# Patient Record
Sex: Female | Born: 1970 | Race: Black or African American | Hispanic: No | Marital: Single | Smoking: Never smoker
Health system: Southern US, Community
[De-identification: ages and names within clinical notes are randomized; demographics above are authoritative.]

## PROBLEM LIST (undated history)

## (undated) DIAGNOSIS — I890 Lymphedema, not elsewhere classified: Secondary | ICD-10-CM

## (undated) HISTORY — PX: ABDOMINAL HYSTERECTOMY: SHX81

---

## 2002-01-22 ENCOUNTER — Ambulatory Visit (HOSPITAL_COMMUNITY): Admission: RE | Admit: 2002-01-22 | Discharge: 2002-01-22 | Payer: Self-pay | Admitting: Internal Medicine

## 2002-01-22 ENCOUNTER — Encounter: Payer: Self-pay | Admitting: Internal Medicine

## 2002-02-06 ENCOUNTER — Encounter: Admission: RE | Admit: 2002-02-06 | Discharge: 2002-05-07 | Payer: Self-pay | Admitting: Internal Medicine

## 2005-05-27 ENCOUNTER — Inpatient Hospital Stay (HOSPITAL_COMMUNITY): Admission: EM | Admit: 2005-05-27 | Discharge: 2005-06-02 | Payer: Self-pay | Admitting: Emergency Medicine

## 2008-03-26 ENCOUNTER — Other Ambulatory Visit: Admission: RE | Admit: 2008-03-26 | Discharge: 2008-03-26 | Payer: Self-pay | Admitting: Obstetrics and Gynecology

## 2009-07-09 ENCOUNTER — Emergency Department (HOSPITAL_COMMUNITY): Admission: EM | Admit: 2009-07-09 | Discharge: 2009-07-09 | Payer: Self-pay | Admitting: Emergency Medicine

## 2009-07-10 ENCOUNTER — Ambulatory Visit (HOSPITAL_COMMUNITY): Admission: RE | Admit: 2009-07-10 | Discharge: 2009-07-10 | Payer: Self-pay | Admitting: Emergency Medicine

## 2009-10-13 ENCOUNTER — Ambulatory Visit: Admission: RE | Admit: 2009-10-13 | Discharge: 2009-10-13 | Payer: Self-pay | Admitting: Gynecology

## 2009-11-16 ENCOUNTER — Encounter: Payer: Self-pay | Admitting: Obstetrics & Gynecology

## 2009-11-16 ENCOUNTER — Inpatient Hospital Stay (HOSPITAL_COMMUNITY): Admission: RE | Admit: 2009-11-16 | Discharge: 2009-11-18 | Payer: Self-pay | Admitting: Obstetrics & Gynecology

## 2009-12-21 ENCOUNTER — Ambulatory Visit
Admission: RE | Admit: 2009-12-21 | Discharge: 2009-12-21 | Payer: Self-pay | Source: Home / Self Care | Admitting: Gynecology

## 2010-02-11 ENCOUNTER — Other Ambulatory Visit: Admission: RE | Admit: 2010-02-11 | Discharge: 2010-02-11 | Payer: Self-pay | Admitting: Gynecology

## 2010-02-11 ENCOUNTER — Ambulatory Visit: Admission: RE | Admit: 2010-02-11 | Discharge: 2010-02-11 | Payer: Self-pay | Admitting: Gynecology

## 2010-08-12 ENCOUNTER — Other Ambulatory Visit: Admission: RE | Admit: 2010-08-12 | Discharge: 2010-08-12 | Payer: Self-pay | Admitting: Gynecology

## 2010-08-12 ENCOUNTER — Ambulatory Visit: Admission: RE | Admit: 2010-08-12 | Discharge: 2010-08-12 | Payer: Self-pay | Admitting: Gynecology

## 2011-02-20 LAB — CBC
HCT: 25.7 % — ABNORMAL LOW (ref 36.0–46.0)
Hemoglobin: 7.7 g/dL — ABNORMAL LOW (ref 12.0–15.0)
MCHC: 30.1 g/dL (ref 30.0–36.0)
MCHC: 31.1 g/dL (ref 30.0–36.0)
MCV: 61.7 fL — ABNORMAL LOW (ref 78.0–100.0)
Platelets: 221 10*3/uL (ref 150–400)
RBC: 4.17 MIL/uL (ref 3.87–5.11)
RDW: 19.1 % — ABNORMAL HIGH (ref 11.5–15.5)
RDW: 24.9 % — ABNORMAL HIGH (ref 11.5–15.5)
WBC: 4 10*3/uL (ref 4.0–10.5)

## 2011-02-20 LAB — BASIC METABOLIC PANEL
BUN: 4 mg/dL — ABNORMAL LOW (ref 6–23)
CO2: 24 mEq/L (ref 19–32)
Calcium: 8.1 mg/dL — ABNORMAL LOW (ref 8.4–10.5)
Creatinine, Ser: 0.63 mg/dL (ref 0.4–1.2)
Glucose, Bld: 132 mg/dL — ABNORMAL HIGH (ref 70–99)

## 2011-02-20 LAB — CROSSMATCH: ABO/RH(D): O POS

## 2011-02-20 LAB — DIFFERENTIAL
Eosinophils Absolute: 0.2 10*3/uL (ref 0.0–0.7)
Eosinophils Relative: 6 % — ABNORMAL HIGH (ref 0–5)
Lymphocytes Relative: 39 % (ref 12–46)
Monocytes Absolute: 0.4 10*3/uL (ref 0.1–1.0)
Neutrophils Relative %: 45 % (ref 43–77)

## 2011-02-20 LAB — COMPREHENSIVE METABOLIC PANEL
Alkaline Phosphatase: 85 U/L (ref 39–117)
BUN: 10 mg/dL (ref 6–23)
Creatinine, Ser: 0.59 mg/dL (ref 0.4–1.2)
Glucose, Bld: 91 mg/dL (ref 70–99)
Potassium: 3.8 mEq/L (ref 3.5–5.1)
Total Protein: 8.3 g/dL (ref 6.0–8.3)

## 2011-02-20 LAB — PREGNANCY, URINE: Preg Test, Ur: NEGATIVE

## 2011-02-25 LAB — BASIC METABOLIC PANEL
Calcium: 8.8 mg/dL (ref 8.4–10.5)
GFR calc Af Amer: >60 mL/min (ref >60)
GFR calc non Af Amer: >60 mL/min (ref >60)
Glucose, Bld: 98 mg/dL (ref 70–99)
Sodium: 134 mEq/L — ABNORMAL LOW (ref 135–145)

## 2011-02-25 LAB — DIFFERENTIAL
Basophils Absolute: 0 10*3/uL (ref 0.0–0.1)
Lymphocytes Relative: 12 % (ref 12–46)
Monocytes Absolute: 1.2 10*3/uL — ABNORMAL HIGH (ref 0.1–1.0)
Neutro Abs: 7.7 10*3/uL (ref 1.7–7.7)

## 2011-02-25 LAB — CBC
Hemoglobin: 9.3 g/dL — ABNORMAL LOW (ref 12.0–15.0)
RBC: 4.16 MIL/uL (ref 3.87–5.11)
RDW: 18.4 % — ABNORMAL HIGH (ref 11.5–15.5)

## 2011-04-07 NOTE — Discharge Summary (Signed)
Misty Glenn, Misty Glenn            ACCOUNT NO.:  [id number]   MEDICAL RECORD NO.:  [id number]          PATIENT TYPE:  INP   LOCATION:  A336                          FACILITY:  [facility]   PHYSICIAN:  Kingsley Callander. Ouida Sills, MD       DATE OF BIRTH:  June 20, 1971   DATE OF ADMISSION:  05/26/2005  DATE OF DISCHARGE:  07/14/2006LH                                 DISCHARGE SUMMARY   DISCHARGE DIAGNOSES:  1.  Left lower leg cellulitis.  2.  Left leg lymphedema.  3.  Hypokalemia.  4.  Hyponatremia   HOSPITAL COURSE:  This patient is a 40 year old female who presented with a  red, tender, swollen left lower leg.  Her white count was 13.8.  Her exam  was consistent with cellulitis.  She was treated with IV Unasyn.  Her  redness and swelling were slow to improve.  She initially was febrile.  Her  fever resolved.  Her swelling and redness improved.  She was felt to be  stable for discharge on July 14.   She was hyperkalemic at 3.0 on admission.  Potassium was supplemented orally  and normalized to 3.9.  Serum sodium was initially low at 129, but  normalized 137.  Blood cultures were negative.   She was treated with elevation and compression stockings.   FOLLOW UP:  She will be seen in follow-up in my office in 1 week.   DISCHARGE MEDICATIONS:  Augmentin 875 mg b.i.d.       ROF/MEDQ  D:  06/02/2005  T:  06/02/2005  Job:  [id number]

## 2011-04-07 NOTE — H&P (Signed)
Misty Glenn, Misty Glenn            ACCOUNT NO.:  [id number]   MEDICAL RECORD NO.:  [id number]          PATIENT TYPE:  INP   LOCATION:  A336                          FACILITY:  [facility]   PHYSICIAN:  Kingsley Callander. Ouida Sills, MD       DATE OF BIRTH:  01-Apr-1971   DATE OF ADMISSION:  05/26/2005  DATE OF DISCHARGE:  LH                                HISTORY & PHYSICAL   CHIEF COMPLAINT:  Left foot swelling.   HISTORY OF PRESENT ILLNESS:  This patient is a 40 year old African-American  female who presented to the emergency room with a one day history of  swelling, redness and pain in the left foot and lower leg.  She was found to  have erythema extending up into her calf with marked swelling of the foot  and lower leg.  She was febrile to 103.  She was hospitalized and started on  intravenous antibiotics by the on call physician.  She has a history of  lymphedema.   PAST MEDICAL HISTORY:  Unremarkable.   MEDICATIONS:  Lasix 40 mg daily PRN.   ALLERGIES:  COMPAZINE.   SOCIAL HISTORY:  She does not use tobacco, alcohol or recreational drugs.   REVIEW OF SYSTEMS:  Noncontributory.   PHYSICAL EXAMINATION:  VITAL SIGNS:  Temperature initially 103.2.  Pulse  118.  Respirations 20. Blood pressure 115/69.  GENERAL APPEARANCE:  Alert and oriented.  HEENT:  Eyes and oropharynx are unremarkable.  NECK:  No jugular venous distention or lymphadenopathy.  LUNGS:  Clear.  HEART:  Regular with no murmurs.  ABDOMEN:  Nontender, no hepatosplenomegaly.  EXTREMITIES:  There is 3+ edema in the left foot and lower leg.  There is  erythema and increased warmth extending nearly to the knee level.  No  ulcerations are present.  She has tinea type changes between her toes.  NEUROLOGICAL:  Intact.  LYMPH NODES:  No enlargement.   LABORATORY DATA:  White count 13.8 with 83 segs and 5 bands. Hemoglobin  13.9, platelet count 209,000.  Sodium 129, potassium 3.0, bicarb 24, glucose  109, BUN 7, creatinine 1.0.   IMPRESSION:  1.  Left lower leg cellulitis.  Blood cultures have been obtained.  She was      started on intravenous Rocephin.  She will be switched to intravenous      Unasyn.  The leg will be elevated and observed in a hospitalized setting      for now.  2.  Hypokalemia.  Will supplement orally.  3.  Hyponatremia.  4.  Tinea pedis.  5.  History of lymphedema.       ROF/MEDQ  D:  05/27/2005  T:  05/27/2005  Job:  [id number]

## 2014-09-03 ENCOUNTER — Other Ambulatory Visit (HOSPITAL_COMMUNITY): Payer: Self-pay | Admitting: Internal Medicine

## 2014-09-03 DIAGNOSIS — Z139 Encounter for screening, unspecified: Secondary | ICD-10-CM

## 2014-09-07 ENCOUNTER — Ambulatory Visit (HOSPITAL_COMMUNITY): Payer: Self-pay

## 2014-09-09 ENCOUNTER — Ambulatory Visit (HOSPITAL_COMMUNITY)
Admission: RE | Admit: 2014-09-09 | Discharge: 2014-09-09 | Disposition: A | Discharge status: Home / Self Care | Payer: BC Managed Care – PPO | Source: Ambulatory Visit | Attending: Internal Medicine | Admitting: Internal Medicine

## 2014-09-09 DIAGNOSIS — Z1231 Encounter for screening mammogram for malignant neoplasm of breast: Secondary | ICD-10-CM | POA: Insufficient documentation

## 2014-09-09 DIAGNOSIS — Z139 Encounter for screening, unspecified: Secondary | ICD-10-CM

## 2015-09-08 ENCOUNTER — Ambulatory Visit (HOSPITAL_COMMUNITY): Payer: BC Managed Care – PPO | Attending: Internal Medicine | Admitting: Physical Therapy

## 2015-09-08 DIAGNOSIS — I890 Lymphedema, not elsewhere classified: Secondary | ICD-10-CM | POA: Diagnosis not present

## 2015-09-08 DIAGNOSIS — R29898 Other symptoms and signs involving the musculoskeletal system: Secondary | ICD-10-CM | POA: Diagnosis present

## 2015-09-08 NOTE — Therapy (Signed)
[facility] [facility] [address] [place], [place], [address] Phone: [phone]   Fax:  [phone]  Physical Therapy Evaluation  Patient Details  Name: Misty Glenn MRN: [id number] Date of Birth: 09-08-1971 Referring Provider: Dr. Ouida SillsFagan  Encounter Date: 09/08/2015      PT End of Session - 09/08/15 1225    Visit Number 1   Number of Visits 12   Date for PT Re-Evaluation 10/08/15   Authorization Type BCBS   PT Start Time 0800   PT Stop Time 0850   PT Time Calculation (min) 50 min   Activity Tolerance Patient tolerated treatment well   Behavior During Therapy [facility] for tasks assessed/performed      No past medical history on file.  No past surgical history on file.  There were no vitals filed for this visit.  Visit Diagnosis:  Lymphedema  Leg heaviness      Subjective Assessment - 09/08/15 1217    Subjective Pt states that she began with cellulitis in May of 2009 in her Lt LE.  She has had no injury or trouma to this area.  She had another flare up of lymphedema in September where she was not hospitalized but she had blisters, and extreme edema which  needed antibiotics.  She was required to be out of work for two weeks.    Pertinent History cellulitis    How long can you sit comfortably? no problem   How long can you stand comfortably? no probelm    How long can you walk comfortably? no problem    Currently in Pain? No/denies  states that her leg feels tight and heavy             [facility] - 09/08/15 0001    Assessment   Medical Diagnosis lymphedema   Referring Provider Dr. Ouida SillsFagan   Onset Date/Surgical Date 07/25/15   Prior Therapy none   Precautions   Precautions --  cellulitis   Restrictions   Weight Bearing Restrictions No   Balance Screen   Has the patient fallen in the past 6 months No   Has the patient had a decrease in activity level because of a fear of falling?  No   Is the patient reluctant  to leave their home because of a fear of falling?  No   Home [employer] manag[employer]   Living Environment Private residence   Prior Function   Level of Independence Independent   Vocation Full time employment   Vocation Requirements on feet 50% of the time   Leisure would like to start exercising    Cognition   Overall Cognitive Status Within Functional Limits for tasks assessed   Observation/Other Assessments   Observations Lt leg is edematous    Other Surveys  --  lymphedema life impact:   66       Date 09/08/2015 09/08/2015   Rt  LT  MTP 22.30 26.5  ankle 25.00 29.30  4cm 24.20 27.80  8cm 27.50 29.30  12 cm 30.00 30.90  16cm 29.70 33.00  20cm 36.80 36.70  24cm 37.80 39.30  28cm 40.00 40.40  32cm 38.70 39.80  36cm 39.70 39.20  40cm    44cm    48cm                        Sum of squares 11703.13 12880.10  Total Volume 3725.2233 [id number]  [facility] Adult PT Treatment/Exercise - 09/08/15 0001    Manual Therapy   Manual Therapy Manual Lymphatic Drainage (MLD)   Manual Lymphatic Drainage (MLD) supraclavicular, deep and superficial abdominal followed by routing using inguinal axillary anastomosist and Lt LE hip to toes.  Postereior not completed due to time restraints.                 PT Education - 09/08/15 1224    Education provided Yes   Education Details given information on lympedema; exercise sheet for lymphedema, and bandage order sheet.    Person(s) Educated Patient   Methods Explanation;Handout   Comprehension Verbalized understanding          PT Short Term Goals - 09/08/15 1250    PT SHORT TERM GOAL #1   Title Pt to be I in skin care and care of bandages   Time 1   Period Weeks   PT SHORT TERM GOAL #2   Title Pt to verbalize precautions for decreasing the risk of infection    Time 1   Period Weeks   PT SHORT TERM GOAL #3   Title Pt to be I in a Home exercise program to promote lymphatic circulation    Time 1   Period Weeks    PT SHORT TERM GOAL #4   Title Pt tvolume to be decreased by 50%   Time 2   Period Weeks           PT Long Term Goals - 09/08/15 1252    PT LONG TERM GOAL #1   Title Pt to edema to be reduced by 80% to reduce risk of cellulitis.    Time 4   Period Weeks   PT LONG TERM GOAL #2   Title Pt Lymphedma life impact score to decrease by 30 points    Time 4   Period Weeks   PT LONG TERM GOAL #3   Title Pt to verbalize understanding of the maintenance phase of treatment   Time 4   Period Weeks               Plan - 09/08/15 1255    Clinical Impression Statement [name] is a 44 yo female who was diagnosed with lymphedema.  Her last flare up was in September; at this time her leg got so big that she had multiple blisters and was diagnosed with cellulitis.  She was placed on medication and stayed out of work for two weeks.  Her Lt LE is still swollen,( 374.63 cc greater than the Rt LE), although this is not nearly as swollen as it was in September.  She has been referred to skilled  out-patient physical therapy for manual techniques to decrease her volume as welll as to educate pt on how to keep this condition under control.    Pt will benefit from skilled therapeutic intervention in order to improve on the following deficits Decreased knowledge of precautions;Increased edema   Rehab Potential Good   PT Frequency 3x / week   PT Duration 4 weeks   PT Treatment/Interventions ADLs/Self Care Home Management;Patient/family education;Manual lymph drainage;Compression bandaging   PT Next Visit Plan cut foam for LE; continue with manual and begin multilayer compression bandaging.    PT Home Exercise Plan given   Consulted and Agree with Plan of Care Patient         Problem List There are no active problems to display for this patient. Virgina Organ, PT CLT [phone] 09/08/2015, 1:01  PM  [place] [facility] [address] [place],  [place], [address] Phone: [phone]   Fax:  [phone]  Name: Misty Glenn MRN: [id number] Date of Birth: Nov 06, 1971

## 2015-09-14 ENCOUNTER — Ambulatory Visit (HOSPITAL_COMMUNITY): Payer: BC Managed Care – PPO | Admitting: Physical Therapy

## 2015-09-14 DIAGNOSIS — I890 Lymphedema, not elsewhere classified: Secondary | ICD-10-CM | POA: Diagnosis not present

## 2015-09-14 DIAGNOSIS — R29898 Other symptoms and signs involving the musculoskeletal system: Secondary | ICD-10-CM

## 2015-09-14 NOTE — Therapy (Signed)
[facility] [address] [place], [place], [address] Phone: [phone]   Fax:  [phone]  Physical Therapy Treatment  Patient Details  Name: Misty Glenn MRN: [id number] Date of Birth: [birth year] Referring Provider: Dr. Ouida SillsFagan  Encounter Date: 09/14/2015      PT End of Session - 09/14/15 1608    Visit Number 2   Number of Visits 12   Date for PT Re-Evaluation 10/08/15   Authorization Type BCBS   PT Start Time 1520   PT Stop Time 1605   PT Time Calculation (min) 45 min   Activity Tolerance Patient tolerated treatment well   Behavior During Therapy [facility] for tasks assessed/performed      No past medical history on file.  No past surgical history on file.  There were no vitals filed for this visit.  Visit Diagnosis:  Lymphedema  Leg heaviness      Subjective Assessment - 09/14/15 1618    Subjective Pt comes today with bandages ordered and ready to begin treatment.  STates she has to work second shift tonight and has to leave by 4:30.  Pt without pain today.   Currently in Pain? No/denies                 PT Education - 09/14/15 1615    Education provided Yes   Education Details lymphedema causes and treatment.  Given copy of evaluation   Person(s) Educated Patient   Methods Explanation;Handout   Comprehension Verbalized understanding;Need further instruction          PT Short Term Goals - 09/14/15 1614    PT SHORT TERM GOAL #1   Title Pt to be I in skin care and care of bandages   Time 1   Period Weeks   Status On-going   PT SHORT TERM GOAL #2   Title Pt to verbalize precautions for decreasing the risk of infection    Time 1   Period Weeks   Status On-going   PT SHORT TERM GOAL #3   Title Pt to be I in a Home exercise program to promote lymphatic circulation    Time 1   Period Weeks   Status On-going   PT SHORT TERM GOAL #4   Title Pt volume to be decreased by 50%   Time 2   Period Weeks    Status On-going           PT Long Term Goals - 09/14/15 1614    PT LONG TERM GOAL #1   Title Pt to edema to be reduced by 80% to reduce risk of cellulitis.    Time 4   Period Weeks   Status On-going   PT LONG TERM GOAL #2   Title Pt Lymphedma life impact score to decrease by 30 points    Time 4   Period Weeks   Status On-going   PT LONG TERM GOAL #3   Title Pt to verbalize understanding of the maintenance phase of treatment   Time 4   Period Weeks   Status On-going               Plan - 09/14/15 1608    Clinical Impression Statement Unablle to complete manual lymph drainage today due to time.  Spent majority of session today on education.  Explanation as to what lymphedema is, why we use massage and bandaging techniques and overall treatment goals.  Pt given copy of initial evaluation wtih individualized goals  reviewed.  Foam cut for proper fit of distal Lt LE including dorsal aspect of foot.  LE moisturized and bandaged using #7 netting and multilayer compression bandages. Pt instructed to get shoes with larger toe box as ones wore to therapy would not fit over bandages.  Pt instructed to remove bandages if increased pain, numbness or unable to find proper shoes today.  Pt with interest in attending water aerobics session tomorrow.  Instructed to remove bandages prior to session and re-roll.        Pt will benefit from skilled therapeutic intervention in order to improve on the following deficits Decreased knowledge of precautions;Increased edema   Rehab Potential Good   PT Frequency 3x / week   PT Duration 4 weeks   PT Treatment/Interventions ADLs/Self Care Home Management;Patient/family education;Manual lymph drainage;Compression bandaging   PT Next Visit Plan Continue with complete decongestive therapy including Manual lymph drainage and compression bandaging.  Begin toe wraps next session.    PT Home Exercise Plan given   Consulted and Agree with Plan of Care Patient         Problem List There are no active problems to display for this patient.   Lurena Nida, PTA/CLT [phone] 09/14/2015, 4:18 PM  [place] [address] [place], [place], [address] Phone: [phone]   Fax:  [phone]  Name: Misty Glenn MRN: [id number] Date of Birth: [birth year]

## 2015-09-15 ENCOUNTER — Ambulatory Visit (HOSPITAL_COMMUNITY): Payer: BC Managed Care – PPO | Admitting: Physical Therapy

## 2015-09-15 DIAGNOSIS — I890 Lymphedema, not elsewhere classified: Secondary | ICD-10-CM

## 2015-09-15 DIAGNOSIS — R29898 Other symptoms and signs involving the musculoskeletal system: Secondary | ICD-10-CM

## 2015-09-15 NOTE — Therapy (Signed)
[facility] [facility] [address] [place], [place], [address] Phone: [phone]   Fax:  [phone]  Physical Therapy Treatment  Patient Details  Name: Misty Glenn MRN: [id number] Date of Birth: [birth year] Referring Provider: Dr. Ouida SillsFagan  Encounter Date: 09/15/2015      PT End of Session - 09/15/15 1645    Visit Number 3   Number of Visits 12   Date for PT Re-Evaluation 10/08/15   Authorization Type BCBS   PT Start Time 1515   PT Stop Time 1600   PT Time Calculation (min) 45 min   Activity Tolerance Patient tolerated treatment well   Behavior During Therapy [facility] for tasks assessed/performed      No past medical history on file.  No past surgical history on file.  There were no vitals filed for this visit.  Visit Diagnosis:  Lymphedema  Leg heaviness      Subjective Assessment - 09/15/15 1641    Subjective Pt states she kept it on until this morining at 5am.  States it began sliding down at work last night. PT reports she went to water aerobics this morning and loved it.    Currently in Pain? No/denies                         [facility] Treatment/Exercise - 09/15/15 1643    Manual Therapy   Manual Therapy Manual Lymphatic Drainage (MLD)   Manual therapy comments compression bandaging to Lt LE including toe wraps.  #7 netting, 1/2" foam, 5 layers short stretch    Manual Lymphatic Drainage (MLD) supraclavicular, deep and superficial abdominal followed by routing using inguinal axillary anastomosist and Lt LE hip to toes.  Postereior not completed due to time restraints.                 PT Education - 09/14/15 1615    Education provided Yes   Education Details lymphedema causes and treatment.  Given copy of evaluation   Person(s) Educated Patient   Methods Explanation;Handout   Comprehension Verbalized understanding;Need further instruction          PT Short Term Goals - 09/14/15 1614    PT SHORT TERM GOAL #1   Title Pt to be I in skin care and care of bandages   Time 1   Period Weeks   Status On-going   PT SHORT TERM GOAL #2   Title Pt to verbalize precautions for decreasing the risk of infection    Time 1   Period Weeks   Status On-going   PT SHORT TERM GOAL #3   Title Pt to be I in a Home exercise program to promote lymphatic circulation    Time 1   Period Weeks   Status On-going   PT SHORT TERM GOAL #4   Title Pt volume to be decreased by 50%   Time 2   Period Weeks   Status On-going           PT Long Term Goals - 09/14/15 1614    PT LONG TERM GOAL #1   Title Pt to edema to be reduced by 80% to reduce risk of cellulitis.    Time 4   Period Weeks   Status On-going   PT LONG TERM GOAL #2   Title Pt Lymphedma life impact score to decrease by 30 points    Time 4   Period Weeks   Status On-going  PT LONG TERM GOAL #3   Title Pt to verbalize understanding of the maintenance phase of treatment   Time 4   Period Weeks   Status On-going               Plan - 09/15/15 1645    Clinical Impression Statement Completed manual lymph drainage for Lt LE followed by moisurizing and compression bandaging.  Included toes today with patient instructed with step by step as she will begin banding independently. Noted reduction from one treatment with most induration on dorsal aspect of foot.    PT Next Visit Plan Continue with complete decongestive therapy including Manual lymph drainage and compression bandaging.  Continue wiith instruction and complete self bandaging. Measure on Wednesdays.   PT Home Exercise Plan given   Consulted and Agree with Plan of Care Patient        Problem List There are no active problems to display for this patient.   Lurena Nida, PTA/CLT [phone] 09/15/2015, 4:48 PM  [facility] [address] [place], [place], [address] Phone: [phone]   Fax:  [phone]  Name:  Misty Glenn MRN: [id number] Date of Birth: [birth year]

## 2015-09-16 ENCOUNTER — Encounter (HOSPITAL_COMMUNITY): Payer: BC Managed Care – PPO | Admitting: Physical Therapy

## 2015-09-17 ENCOUNTER — Ambulatory Visit (HOSPITAL_COMMUNITY): Payer: BC Managed Care – PPO | Admitting: Physical Therapy

## 2015-09-17 DIAGNOSIS — I890 Lymphedema, not elsewhere classified: Secondary | ICD-10-CM

## 2015-09-17 DIAGNOSIS — R29898 Other symptoms and signs involving the musculoskeletal system: Secondary | ICD-10-CM

## 2015-09-17 NOTE — Therapy (Addendum)
[place], [place], [address] Phone: [phone]   Fax:  [phone]  Physical Therapy Treatment  Patient Details  Name: Misty Glenn MRN: [id number] Date of Birth: [birth year] Referring Raegen Tarpley: Dr. Willey Blade  Encounter Date: 09/17/2015      PT End of Session - 09/17/15 1446    Visit Number 4   Number of Visits 12   Date for PT Re-Evaluation 10/08/15   Authorization Type BCBS   PT Start Time 1303   PT Stop Time 1350   PT Time Calculation (min) 47 min   Activity Tolerance Patient tolerated treatment well   Behavior During Therapy [facility] for tasks assessed/performed      No past medical history on file.  No past surgical history on file.  There were no vitals filed for this visit.  Visit Diagnosis:  Lymphedema  Leg heaviness      Subjective Assessment - 09/17/15 1443    Subjective Pt pleased with progress, however,  states her toe bandages came off.  Pt went to water aerobics today.    Currently in Pain? No/denies                [facility] Adult PT Treatment/Exercise - 09/17/15 1444    Manual Therapy   Manual Therapy Manual Lymphatic Drainage (MLD)   Manual therapy comments compression bandaging to Lt LE including toe wraps.  #7 netting, 1/2" foam, 5 layers short stretch    Manual Lymphatic Drainage (MLD) supraclavicular, deep and superficial abdominal followed by routing using inguinal axillary anastomosist and Lt LE hip to toes.  Postereior completed in prone.                PT Education - 09/17/15 1446    Education provided Yes   Education Details given self bandaging sheet as well as care for short stretch bandages    Person(s) Educated Patient   Methods Handout   Comprehension Verbalized understanding          PT Short Term Goals - 09/14/15 1614    PT SHORT TERM GOAL #1   Title Pt to be I in skin care and care of bandages   Time 1   Period Weeks   Status On-going   PT  SHORT TERM GOAL #2   Title Pt to verbalize precautions for decreasing the risk of infection    Time 1   Period Weeks   Status On-going   PT SHORT TERM GOAL #3   Title Pt to be I in a Home exercise program to promote lymphatic circulation    Time 1   Period Weeks   Status On-going   PT SHORT TERM GOAL #4   Title Pt volume to be decreased by 50%   Time 2   Period Weeks   Status On-going           PT Long Term Goals - 09/14/15 1614    PT LONG TERM GOAL #1   Title Pt to edema to be reduced by 80% to reduce risk of cellulitis.    Time 4   Period Weeks   Status On-going   PT LONG TERM GOAL #2   Title Pt Lymphedma life impact score to decrease by 30 points    Time 4   Period Weeks   Status On-going   PT LONG TERM GOAL #3   Title Pt to verbalize understanding of the maintenance phase of treatment   Time 4  Period Weeks   Status On-going               Plan - 09/17/15 1446    Clinical Impression Statement Cut foam for ankle.  Completed manual lymph drainage both anteriorly and posterioly.  Attempted to wrap toes again.     PT Next Visit Plan Check to see if toe wraps stayed on any better,  Continue with both anterior and posterior manual techniques.  Remeasure on Wed.         Problem List There are no active problems to display for this patient.   Cynthia Russell, PT CLT [phone] 09/17/2015, 2:49 PM  [facility] [address] [place], [place], [address] Phone: [phone]   Fax:  [phone]  Name: Misty Glenn MRN: [id number] Date of Birth: 07/12/1971    PHYSICAL THERAPY DISCHARGE SUMMARY  Visits from Start of Care: 4  Current functional level related to goals / functional outcomes: Decreased edema   Remaining deficits: edema   Education / Equipment: For precautions and exercise to improve lymphatic circulation   Plan: Patient agrees to discharge.  Patient goals were not met. Patient is  being discharged due to not returning since the last visit.  ?????     Cynthia Russell, PT CLT [phone]  

## 2015-09-21 ENCOUNTER — Ambulatory Visit (HOSPITAL_COMMUNITY): Payer: BC Managed Care – PPO | Attending: Internal Medicine | Admitting: Physical Therapy

## 2015-09-21 DIAGNOSIS — I890 Lymphedema, not elsewhere classified: Secondary | ICD-10-CM | POA: Diagnosis not present

## 2015-09-21 DIAGNOSIS — R29898 Other symptoms and signs involving the musculoskeletal system: Secondary | ICD-10-CM

## 2015-09-21 NOTE — Therapy (Signed)
[facility] [address] [place], [place], [address] Phone: [phone]   Fax:  [phone]  Physical Therapy Treatment  Patient Details  Name: Misty Glenn MRN: [id number] Date of Birth: 1971-02-15 Referring Provider: Dr. Ouida Sills  Encounter Date: 09/21/2015      PT End of Session - 09/21/15 0905    Visit Number 5   Number of Visits 12   Date for PT Re-Evaluation 10/08/15   Authorization Type BCBS   PT Start Time 0800   PT Stop Time 0855   PT Time Calculation (min) 55 min   Activity Tolerance Patient tolerated treatment well   Behavior During Therapy [facility] for tasks assessed/performed      No past medical history on file.  No past surgical history on file.  There were no vitals filed for this visit.  Visit Diagnosis:  Lymphedema  Leg heaviness      Subjective Assessment - 09/21/15 0856    Subjective Pt states she is going to be away at a conference on Friday and will not be able to make her appt.  States she also has conflict with Thursday appt.  Pt. wanting to learn self bandaging today in case she cant return this week.    Currently in Pain? No/denies                         [facility] Adult PT Treatment/Exercise - 09/21/15 0001    Manual Therapy   Manual therapy comments compression bandaging to Lt LE including toe wraps.  #7 netting, 1/2" foam, 5 layers short stretch                 PT Education - 09/21/15 0857    Education provided Yes   Education Details self bandaging   Person(s) Educated Patient   Methods Explanation   Comprehension Verbalized understanding;Returned demonstration;Need further instruction          PT Short Term Goals - 09/14/15 1614    PT SHORT TERM GOAL #1   Title Pt to be I in skin care and care of bandages   Time 1   Period Weeks   Status On-going   PT SHORT TERM GOAL #2   Title Pt to verbalize precautions for decreasing the risk of infection    Time 1   Period Weeks   Status On-going   PT SHORT TERM GOAL #3   Title Pt to be I in a Home exercise program to promote lymphatic circulation    Time 1   Period Weeks   Status On-going   PT SHORT TERM GOAL #4   Title Pt volume to be decreased by 50%   Time 2   Period Weeks   Status On-going           PT Long Term Goals - 09/14/15 1614    PT LONG TERM GOAL #1   Title Pt to edema to be reduced by 80% to reduce risk of cellulitis.    Time 4   Period Weeks   Status On-going   PT LONG TERM GOAL #2   Title Pt Lymphedma life impact score to decrease by 30 points    Time 4   Period Weeks   Status On-going   PT LONG TERM GOAL #3   Title Pt to verbalize understanding of the maintenance phase of treatment   Time 4   Period Weeks   Status On-going  Plan - 09/21/15 0905    Clinical Impression Statement Focused session on self education and ability to complete self bandaging.  Pt with good knowledge base for appropriate steps, however diffiuculty with actual technique.  Pt gives appropriate stretch of bandaging, just requires directional cues.  Manual not completed today due to time.  Pt will need reinforcement and further practice with bandaging.     PT Next Visit Plan Check to see if toe wraps stayed on any better,  Continue with self bandaging education and resume anterior and posterior manual techniques.  Remeasure next session.         Problem List There are no active problems to display for this patient.   Lurena Nidamy B Frazier, PTA/CLT [phone]  09/21/2015, 9:09 AM   [facility] [address] [place], [place], [address] Phone: [phone]   Fax:  [phone]  Name: Misty Glenn MRN: [id number] Date of Birth: September 26, 1971

## 2015-09-22 ENCOUNTER — Ambulatory Visit (HOSPITAL_COMMUNITY): Payer: BC Managed Care – PPO | Admitting: Physical Therapy

## 2015-09-23 ENCOUNTER — Encounter (HOSPITAL_COMMUNITY): Payer: BC Managed Care – PPO | Admitting: Physical Therapy

## 2015-09-23 ENCOUNTER — Ambulatory Visit (HOSPITAL_COMMUNITY): Payer: BC Managed Care – PPO | Admitting: Physical Therapy

## 2015-09-23 DIAGNOSIS — R29898 Other symptoms and signs involving the musculoskeletal system: Secondary | ICD-10-CM

## 2015-09-23 DIAGNOSIS — I890 Lymphedema, not elsewhere classified: Secondary | ICD-10-CM

## 2015-09-23 NOTE — Therapy (Signed)
[facility] [address] , [place], [address] Phone: [phone]   Fax:  [phone]  Physical Therapy Treatment  Patient Details  Name: Misty Glenn MRN: [id number] Date of Birth: August 15, 1971 Referring Provider: Dr. Ouida SillsFagan  Encounter Date: 09/23/2015      PT End of Session - 09/23/15 1701    Visit Number 6   Number of Visits 12   Date for PT Re-Evaluation 10/08/15   Authorization Type BCBS   PT Start Time 1520   PT Stop Time 1600   PT Time Calculation (min) 40 min   Activity Tolerance Patient tolerated treatment well   Behavior [facility] Therapy [facility] for tasks assessed/performed      No past medical history on file.  No past surgical history on file.  There were no vitals filed for this visit.  Visit Diagnosis:  Lymphedema  Leg heaviness      Subjective Assessment - 09/23/15 1700    Subjective Pt states she is getting better at bandaging herself.  STates she didnt do it yesterday but plans to keep herself wrapped this weekend.    Currently in Pain? No/denies       Date 09/08/2015  09/23/2015   Rt Lt Lt  MTP 22.30 26.50 25  ankle 25 29.3 31.00  4cm 24.20 27.80 27.30  8cm 27.50 29.30 28.20  12 cm 30.00 30.90 30.00  16cm 29.70 33.00 32.00  20cm 36.80 36.70 34.30  24cm 37.80 39.30 37.80  28cm 40.00 40.40 39.00  32cm 38.70 39.80 38.50  36cm 39.70 39.20 38.00                                          Sum of squares 11703.13 12880.10 12103.11  Total Volume 3725.2233 4099.865 3852.541                       [facility] Adult PT Treatment/Exercise - 09/23/15 1701    Manual Therapy   Manual Therapy Manual Lymphatic Drainage (MLD)   Manual therapy comments compression bandaging to Lt LE including toe wraps.  #7 netting, 1/2" foam, 5 layers short stretch    Manual Lymphatic Drainage (MLD) supraclavicular, deep and superficial abdominal followed by routing using inguinal axillary anastomosist and  Lt LE hip to toes.  Postereior completed in prone.                  PT Short Term Goals - 09/14/15 1614    PT SHORT TERM GOAL #1   Title Pt to be I in skin care and care of bandages   Time 1   Period Weeks   Status On-going   PT SHORT TERM GOAL #2   Title Pt to verbalize precautions for decreasing the risk of infection    Time 1   Period Weeks   Status On-going   PT SHORT TERM GOAL #3   Title Pt to be I in a Home exercise program to promote lymphatic circulation    Time 1   Period Weeks   Status On-going   PT SHORT TERM GOAL #4   Title Pt volume to be decreased by 50%   Time 2   Period Weeks   Status On-going           PT Long Term Goals - 09/14/15 1614    PT LONG TERM GOAL #1  Title Pt to edema to be reduced by 80% to reduce risk of cellulitis.    Time 4   Period Weeks   Status On-going   PT LONG TERM GOAL #2   Title Pt Lymphedma life impact score to decrease by 30 points    Time 4   Period Weeks   Status On-going   PT LONG TERM GOAL #3   Title Pt to verbalize understanding of the maintenance phase of treatment   Time 4   Period Weeks   Status On-going               Plan - 09/23/15 1701    Clinical Impression Statement continued with self bandaging education and triaining.  Pt able to complete with 30% cues today as compared to 75% cues last session. Pt without pain or difficulty.  Measured Lt LE with total decompression of 247.32 cc from initial evaluation and only 127.32 larger than Rt LE now.    PT Next Visit Plan Continue with self bandaging education and manual lymph drainage.          Problem List There are no active problems to display for this patient.   Lurena Nida, PTA/CLT [phone] 09/23/2015, 5:03 PM  [place] [address] [place], [place], [address] Phone: [phone]   Fax:  [phone]  Name: Misty Glenn MRN: [id number] Date of Birth: [birth year]

## 2015-09-28 ENCOUNTER — Ambulatory Visit (HOSPITAL_COMMUNITY): Payer: BC Managed Care – PPO | Admitting: Physical Therapy

## 2015-09-28 DIAGNOSIS — I890 Lymphedema, not elsewhere classified: Secondary | ICD-10-CM | POA: Diagnosis not present

## 2015-09-28 DIAGNOSIS — R29898 Other symptoms and signs involving the musculoskeletal system: Secondary | ICD-10-CM

## 2015-09-28 NOTE — Therapy (Signed)
[facility] [address] [place], [place], [address] Phone: [phone]   Fax:  [phone]  Physical Therapy Treatment  Patient Details  Name: Misty Glenn MRN: [id number] Date of Birth: August 02, 1971 Referring Provider: Dr. Ouida SillsFagan  Encounter Date: 09/28/2015      PT End of Session - 09/28/15 0946    Visit Number 7   Number of Visits 12   Date for PT Re-Evaluation 10/08/15   Authorization Type BCBS   PT Start Time 0802   PT Stop Time 0847   PT Time Calculation (min) 45 min   Activity Tolerance Patient tolerated treatment well   Behavior During Therapy [facility] for tasks assessed/performed      No past medical history on file.  No past surgical history on file.  There were no vitals filed for this visit.  Visit Diagnosis:  Lymphedema  Leg heaviness      Subjective Assessment - 09/28/15 0944    Subjective Pt states she was bad over the weekend and didnt wrap her leg because she had no tape.  Gave patient several locations that sell masking tape. Pt without pain and pleased with overall decongestion of LE.    Currently in Pain? No/denies                         [facility] Adult PT Treatment/Exercise - 09/28/15 0946    Manual Therapy   Manual Therapy Manual Lymphatic Drainage (MLD)   Manual therapy comments compression bandaging to Lt LE including toe wraps.  #7 netting, 1/2" foam, 5 layers short stretch    Manual Lymphatic Drainage (MLD) supraclavicular, deep and superficial abdominal followed by routing using inguinal axillary anastomosist and Lt LE hip to toes.  Postereior completed in prone.                  PT Short Term Goals - 09/14/15 1614    PT SHORT TERM GOAL #1   Title Pt to be I in skin care and care of bandages   Time 1   Period Weeks   Status On-going   PT SHORT TERM GOAL #2   Title Pt to verbalize precautions for decreasing the risk of infection    Time 1   Period Weeks   Status  On-going   PT SHORT TERM GOAL #3   Title Pt to be I in a Home exercise program to promote lymphatic circulation    Time 1   Period Weeks   Status On-going   PT SHORT TERM GOAL #4   Title Pt volume to be decreased by 50%   Time 2   Period Weeks   Status On-going           PT Long Term Goals - 09/14/15 1614    PT LONG TERM GOAL #1   Title Pt to edema to be reduced by 80% to reduce risk of cellulitis.    Time 4   Period Weeks   Status On-going   PT LONG TERM GOAL #2   Title Pt Lymphedma life impact score to decrease by 30 points    Time 4   Period Weeks   Status On-going   PT LONG TERM GOAL #3   Title Pt to verbalize understanding of the maintenance phase of treatment   Time 4   Period Weeks   Status On-going               Plan -  09/28/15 0947    Clinical Impression Statement Manual lymph draiange completed for Lt LE with further education and manual assistance for bandaging technique.  Pt able to recall 80% of steps, however stilll having dificulty wtih overall technique including orientation of bandages (tails up and "snail" ) and spacing and tension of bandages.  Continued to have patient complete self bandaging to practvie technique with corrections as needed.    PT Next Visit Plan Continue with self bandaging education and manual lymph drainage.          Problem List There are no active problems to display for this patient.   Lurena Nida, PTA/CLT [phone]  09/28/2015, 9:53 AM   [facility] [address] [place], [place], [address] Phone: [phone]   Fax:  [phone]  Name: Misty Glenn MRN: [id number] Date of Birth: 1971/07/23

## 2015-09-30 ENCOUNTER — Ambulatory Visit (HOSPITAL_COMMUNITY): Payer: BC Managed Care – PPO | Admitting: Physical Therapy

## 2015-09-30 ENCOUNTER — Telehealth (HOSPITAL_COMMUNITY): Payer: Self-pay | Admitting: Physical Therapy

## 2015-09-30 NOTE — Telephone Encounter (Signed)
Patient called back and reported that she did not show up this morning because she was not able to make it, and that she had tried to cancel the appointment earlier. Educated patient regarding time and date of next appointment.  Nedra HaiKristen Taten Merrow PT, DPT [phone]

## 2015-09-30 NOTE — Telephone Encounter (Signed)
PT did not show for appointment.  Called and left message on machine regarding missed appointment.

## 2015-10-01 ENCOUNTER — Ambulatory Visit (HOSPITAL_COMMUNITY): Payer: BC Managed Care – PPO | Admitting: Physical Therapy

## 2015-10-01 DIAGNOSIS — I890 Lymphedema, not elsewhere classified: Secondary | ICD-10-CM

## 2015-10-01 DIAGNOSIS — R29898 Other symptoms and signs involving the musculoskeletal system: Secondary | ICD-10-CM

## 2015-10-01 NOTE — Therapy (Signed)
[facility] [address] [place], [place], [address] Phone: [phone]   Fax:  [phone]  Physical Therapy Treatment  Patient Details  Name: Misty Glenn MRN: [id number] Date of Birth: 1971/11/12 Referring Provider: Dr. Ouida Sills  Encounter Date: 10/01/2015      PT End of Session - 10/01/15 1246    Visit Number 8   Number of Visits 12   Date for PT Re-Evaluation 10/08/15   Authorization Type BCBS   PT Start Time 1016   PT Stop Time 1110   PT Time Calculation (min) 54 min   Activity Tolerance Patient tolerated treatment well   Behavior During Therapy [facility] for tasks assessed/performed      No past medical history on file.  No past surgical history on file.  There were no vitals filed for this visit.  Visit Diagnosis:  Lymphedema  Leg heaviness      Subjective Assessment - 10/01/15 1243    Subjective Pt states she did not have her leg wrapped yesterday.  No real reason given as to why.  Pt pleased with overall decongestion of LE    Currently in Pain? No/denies                         [facility] Adult PT Treatment/Exercise - 10/01/15 1244    Manual Therapy   Manual Therapy Manual Lymphatic Drainage (MLD)   Manual therapy comments compression bandaging to Lt LE including toe wraps.  #7 netting, 1/2" foam, 5 layers short stretch    Manual Lymphatic Drainage (MLD) supraclavicular, deep and superficial abdominal followed by routing using inguinal axillary anastomosist and Lt LE hip to toes.  Postereior completed in prone.                PT Education - 10/01/15 1245    Education provided Yes   Education Details self bandaging as well as the importance of keeping LE wraped with short stretch or compression garment.    Person(s) Educated Patient   Comprehension Verbalized understanding          PT Short Term Goals - 09/14/15 1614    PT SHORT TERM GOAL #1   Title Pt to be I in skin care and care  of bandages   Time 1   Period Weeks   Status On-going   PT SHORT TERM GOAL #2   Title Pt to verbalize precautions for decreasing the risk of infection    Time 1   Period Weeks   Status On-going   PT SHORT TERM GOAL #3   Title Pt to be I in a Home exercise program to promote lymphatic circulation    Time 1   Period Weeks   Status On-going   PT SHORT TERM GOAL #4   Title Pt volume to be decreased by 50%   Time 2   Period Weeks   Status On-going           PT Long Term Goals - 09/14/15 1614    PT LONG TERM GOAL #1   Title Pt to edema to be reduced by 80% to reduce risk of cellulitis.    Time 4   Period Weeks   Status On-going   PT LONG TERM GOAL #2   Title Pt Lymphedma life impact score to decrease by 30 points    Time 4   Period Weeks   Status On-going   PT LONG TERM GOAL #3  Title Pt to verbalize understanding of the maintenance phase of treatment   Time 4   Period Weeks   Status On-going               Plan - 10/01/15 1246    Clinical Impression Statement MLD completed for LE with continued education on wrapping and the importance of keeping LE in some sort of compression.  Pt continues to have difficulty with technique and states she has not read the instruction sheet given to her; encouraged pt to read the sheet as she is self wrapping .   Pt will need continued skilled therapy for self instruction as will as decongesition.    PT Next Visit Plan remeasure for volume next visit.         Problem List There are no active problems to display for this patient.   Virgina OrganCynthia Russell, PT CLT [phone] 10/01/2015, 12:50 PM  [facility] [address] [place], [place], [address] Phone: [phone]   Fax:  [phone]  Name: Jimmie MollyChandra L Magana MRN: [id number] Date of Birth: [birth year]

## 2015-10-05 ENCOUNTER — Ambulatory Visit (HOSPITAL_COMMUNITY): Payer: BC Managed Care – PPO | Admitting: Physical Therapy

## 2015-10-05 DIAGNOSIS — I890 Lymphedema, not elsewhere classified: Secondary | ICD-10-CM | POA: Diagnosis not present

## 2015-10-05 DIAGNOSIS — R29898 Other symptoms and signs involving the musculoskeletal system: Secondary | ICD-10-CM

## 2015-10-05 NOTE — Therapy (Signed)
[facility] [address] [place], [place], [address] Phone: [phone]   Fax:  [phone]  Physical Therapy Treatment  Patient Details  Name: Misty Glenn MRN: [id number] Date of Birth: 05/23/1971 Referring Provider: Dr. Ouida Sills  Encounter Date: 10/05/2015      PT End of Session - 10/05/15 0918    Visit Number 9   Number of Visits 12   Date for PT Re-Evaluation 10/08/15   Authorization Type BCBS   PT Start Time 0804   PT Stop Time 0844   PT Time Calculation (min) 40 min   Activity Tolerance Patient tolerated treatment well   Behavior During Therapy [facility] for tasks assessed/performed      No past medical history on file.  No past surgical history on file.  There were no vitals filed for this visit.  Visit Diagnosis:  Lymphedema  Leg heaviness      Subjective Assessment - 10/05/15 0917    Subjective PT states she kept her LE wrapped until Sunday so has not had anything on it for 1 1/2 days.  PT still has more congestion in her ankle and foot region.   Currently in Pain? No/denies                         [facility] Adult PT Treatment/Exercise - 10/05/15 0918    Manual Therapy   Manual Therapy Manual Lymphatic Drainage (MLD)   Manual therapy comments compression bandaging to Lt LE including toe wraps.  #7 netting, 1/2" foam, 5 layers short stretch    Manual Lymphatic Drainage (MLD) supraclavicular, deep and superficial abdominal followed by routing using inguinal axillary anastomosist and Lt LE hip to toes.  Postereior completed in prone.                  PT Short Term Goals - 09/14/15 1614    PT SHORT TERM GOAL #1   Title Pt to be I in skin care and care of bandages   Time 1   Period Weeks   Status On-going   PT SHORT TERM GOAL #2   Title Pt to verbalize precautions for decreasing the risk of infection    Time 1   Period Weeks   Status On-going   PT SHORT TERM GOAL #3   Title Pt to be I  in a Home exercise program to promote lymphatic circulation    Time 1   Period Weeks   Status On-going   PT SHORT TERM GOAL #4   Title Pt volume to be decreased by 50%   Time 2   Period Weeks   Status On-going           PT Long Term Goals - 09/14/15 1614    PT LONG TERM GOAL #1   Title Pt to edema to be reduced by 80% to reduce risk of cellulitis.    Time 4   Period Weeks   Status On-going   PT LONG TERM GOAL #2   Title Pt Lymphedma life impact score to decrease by 30 points    Time 4   Period Weeks   Status On-going   PT LONG TERM GOAL #3   Title Pt to verbalize understanding of the maintenance phase of treatment   Time 4   Period Weeks   Status On-going               Plan - 10/05/15 6720  Clinical Impression Statement Continued with manual lymph drainage for Lt LE followed by continued education on importance of keeping LE bandaged with special attention to foot and ankle region.  Continued with education on self bandaging and had patient complete this process. Abel to complete with decreased cues, however still has most difficulty with roman sandal step.  Toe bandaging completed by thereapist today,.   PT Next Visit Plan Continue manual lymph drainage and bandage education/training.  Remeasure for volume next visit.         Problem List There are no active problems to display for this patient.   Misty Glenn, PTA/CLT [phone]  10/05/2015, 9:22 AM  [facility] [address] [place], [place], [address] Phone: [phone]   Fax:  [phone]  Name: Misty Glenn MRN: [id number] Date of Birth: 1971-07-13

## 2015-10-07 ENCOUNTER — Ambulatory Visit (HOSPITAL_COMMUNITY): Payer: BC Managed Care – PPO | Admitting: Physical Therapy

## 2015-10-07 DIAGNOSIS — R29898 Other symptoms and signs involving the musculoskeletal system: Secondary | ICD-10-CM

## 2015-10-07 DIAGNOSIS — I890 Lymphedema, not elsewhere classified: Secondary | ICD-10-CM | POA: Diagnosis not present

## 2015-10-07 NOTE — Therapy (Signed)
[facility] [address] [place], [place], [address] Phone: [phone]   Fax:  [phone]  Physical Therapy Treatment  Patient Details  Name: Misty Glenn MRN: [id number] Date of Birth: Apr 03, 1971 Referring Provider: Dr. Ouida Sills  Encounter Date: 10/07/2015      PT End of Session - 10/07/15 1234    Visit Number 10   Number of Visits 18   Date for PT Re-Evaluation 10/08/15   Authorization Type BCBS   PT Start Time 0804   PT Stop Time 0850   PT Time Calculation (min) 46 min   Activity Tolerance Patient tolerated treatment well      No past medical history on file.  No past surgical history on file.  There were no vitals filed for this visit.  Visit Diagnosis:  Lymphedema  Leg heaviness      Subjective Assessment - 10/07/15 1232    Subjective Pt states that her bandages were hurting her so she unwrapped them.  At this point she had no blisters.  She rewrapped her LE herself and when she took the wraps off she had blisters on the dorsal aspect of her foot.    Currently in Pain? Yes   Pain Score 3    Pain Location Foot   Pain Orientation Anterior   Pain Descriptors / Indicators Burning      Date 09/08/2015 09/08/2015 10/07/2015   Rt  LT Lt   MTP 22.30 26.5 27.2  ankle 25.00 29.30 29.00  4cm 24.20 27.80 28.10  8cm 27.50 29.30 28.70  12 cm 30.00 30.90 29.50  16cm 29.70 33.00 30.80  20cm 36.80 36.70 33.50  24cm 37.80 39.30 37.50  28cm 40.00 40.40 40.20  32cm 38.70 39.80 41.00  36cm 39.70 39.20 41.20  40cm     44cm     48cm                              Sum of squares 11703.13 12880.10 12536.01  Total Volume 3725.2233 9518.8416 6063.0160                      [facility] Adult PT Treatment/Exercise - 10/07/15 1234    Manual Therapy   Manual Therapy Manual Lymphatic Drainage (MLD)   Manual therapy comments compression bandaging to Lt LE including toe wraps.  #7 netting, 1/2" foam, 5 layers short  stretch    Manual Lymphatic Drainage (MLD) supraclavicular, deep and superficial abdominal followed by routing using inguinal axillary anastomosist and Lt LE hip to toes.  Postereior completed in prone.                  PT Short Term Goals - 10/07/15 1238    PT SHORT TERM GOAL #1   Title Pt to be I in skin care and care of bandages   Time 1   Period Weeks   Status Achieved   PT SHORT TERM GOAL #2   Title Pt to verbalize precautions for decreasing the risk of infection    Time 1   Period Weeks   Status Achieved   PT SHORT TERM GOAL #3   Title Pt to be I in a Home exercise program to promote lymphatic circulation    Time 1   Period Weeks   Status Achieved   PT SHORT TERM GOAL #4   Title Pt volume to be decreased by 50%   Time 2  Period Weeks   Status On-going           PT Long Term Goals - 10/07/15 1238    PT LONG TERM GOAL #1   Title Pt to edema to be reduced by 80% to reduce risk of cellulitis.    Time 6   Period Weeks   Status Revised   PT LONG TERM GOAL #2   Title Pt Lymphedma life impact score to decrease by 30 points    Time 6   Period Weeks   Status Revised   PT LONG TERM GOAL #3   Title Pt to verbalize understanding of the maintenance phase of treatment   Time 4   Period Weeks   Status On-going               Plan - 10/07/15 1235    Clinical Impression Statement Pt with blisters and increased swelling on dorsal aspect of foot.  Pt educated on the need to pull 1/2 the stretch out when wrapping with short stretch bandages so there will not be any sheering. LE washed and moisturized prior to treatment.  Pt has  lost over 350 cc since initial evaluation  But still has stubborn  increased edema in foot.  LE has decreased in edema.  We will see pt an additional 2 weeks to attempt to decrease fluid volume.    Pt will benefit from skilled therapeutic intervention in order to improve on the following deficits Decreased knowledge of  precautions;Increased edema;Pain   Rehab Potential Good   PT Frequency 3x / week   PT Duration 6 weeks   PT Treatment/Interventions ADLs/Self Care Home Management;Patient/family education;Manual lymph drainage;Compression bandaging   PT Next Visit Plan Continue manual lymph drainage and bandage education/training.Reassess blisters on dorsal aspect of foot.      Physical Therapy Progress Note  Dates of Reporting Period: 09/07/2016  to 10/06/2016  Objective Reports of Subjective Statement: see above  Objective Measurements: see above  Goal Update: see above  Plan: continue for a total of 6 weeks   Reason Skilled Services are Required: dorsal aspect of foot still has stubborn edema     Problem List There are no active problems to display for this patient.   RUSSELL,CINDY 10/07/2015, 12:39 PM   [facility] [address] [place], [place], [address] Phone: [phone]   Fax:  [phone]  Name: Misty Glenn MRN: [id number] Date of Birth: 1971-06-24

## 2015-10-08 ENCOUNTER — Ambulatory Visit (HOSPITAL_COMMUNITY): Payer: BC Managed Care – PPO | Admitting: Physical Therapy

## 2015-10-08 DIAGNOSIS — I890 Lymphedema, not elsewhere classified: Secondary | ICD-10-CM

## 2015-10-08 DIAGNOSIS — R29898 Other symptoms and signs involving the musculoskeletal system: Secondary | ICD-10-CM

## 2015-10-08 NOTE — Therapy (Signed)
[facility] [address] [place], [place], [address] Phone: [phone]   Fax:  [phone]  Physical Therapy Treatment  Patient Details  Name: Misty MollyChandra L Syler MRN: [id number] Date of Birth: Nov 18, 1971 Referring Provider: Dr. Ouida SillsFagan  Encounter Date: 10/08/2015      PT End of Session - 10/08/15 0855    Visit Number 11   Number of Visits 18   Date for PT Re-Evaluation 11/05/15   Authorization Type BCBS   PT Start Time 0802   PT Stop Time 0849   PT Time Calculation (min) 47 min   Activity Tolerance Patient tolerated treatment well   Behavior During Therapy [facility] for tasks assessed/performed      No past medical history on file.  No past surgical history on file.  There were no vitals filed for this visit.  Visit Diagnosis:  Lymphedema  Leg heaviness      Subjective Assessment - 10/08/15 0854    Subjective Pt states her foot feels better but the blister is still there    Pain Score 2    Pain Location Foot   Pain Orientation Anterior   Pain Descriptors / Indicators Burning              [facility] Adult PT Treatment/Exercise - 10/08/15 0001    Manual Therapy   Manual Therapy Manual Lymphatic Drainage (MLD)   Manual therapy comments compression bandaging to Lt LE including toe wraps.  #7 netting, 1/2" foam, 5 layers short stretch    Manual Lymphatic Drainage (MLD) supraclavicular, deep and superficial abdominal followed by routing using inguinal axillary anastomosist and Lt LE hip to toes.  Postereior completed in prone.                  PT Short Term Goals - 10/07/15 1238    PT SHORT TERM GOAL #1   Title Pt to be I in skin care and care of bandages   Time 1   Period Weeks   Status Achieved   PT SHORT TERM GOAL #2   Title Pt to verbalize precautions for decreasing the risk of infection    Time 1   Period Weeks   Status Achieved   PT SHORT TERM GOAL #3   Title Pt to be I in a Home exercise program to  promote lymphatic circulation    Time 1   Period Weeks   Status Achieved   PT SHORT TERM GOAL #4   Title Pt volume to be decreased by 50%   Time 2   Period Weeks   Status On-going           PT Long Term Goals - 10/07/15 1238    PT LONG TERM GOAL #1   Title Pt to edema to be reduced by 80% to reduce risk of cellulitis.    Time 6   Period Weeks   Status Revised   PT LONG TERM GOAL #2   Title Pt Lymphedma life impact score to decrease by 30 points    Time 6   Period Weeks   Status Revised   PT LONG TERM GOAL #3   Title Pt to verbalize understanding of the maintenance phase of treatment   Time 4   Period Weeks   Status On-going               Plan - 10/08/15 0856    Clinical Impression Statement Blister area is still present but looks better.  Circumference  at MTP taken = 26.3 was 26.5.    PT to continue skilled therapy.   PT Next Visit Plan Continue manual lymph drainage and bandage education/training.        Problem List There are no active problems to display for this patient.   Virgina Organ, PT CLT [phone] 10/08/2015, 9:01 AM  [facility] [address] [place], [place], [address] Phone: [phone]   Fax:  [phone]  Name: JOHNDA BILLIOT MRN: [id number] Date of Birth: 1971/06/20

## 2015-10-12 ENCOUNTER — Ambulatory Visit (HOSPITAL_COMMUNITY): Payer: BC Managed Care – PPO | Admitting: Physical Therapy

## 2015-10-12 DIAGNOSIS — I890 Lymphedema, not elsewhere classified: Secondary | ICD-10-CM

## 2015-10-12 DIAGNOSIS — R29898 Other symptoms and signs involving the musculoskeletal system: Secondary | ICD-10-CM

## 2015-10-12 NOTE — Therapy (Signed)
[facility] [address] [place], [place], [address] Phone: [phone]   Fax:  [phone]  Physical Therapy Treatment  Patient Details  Name: Misty Glenn MRN: [id number] Date of Birth: [birth year] Referring Provider: Dr. Ouida Sills  Encounter Date: 10/12/2015      PT End of Session - 10/12/15 1431    Visit Number 12   Number of Visits 18   Date for PT Re-Evaluation 11/05/15   Authorization Type BCBS   PT Start Time 1300   PT Stop Time 1350   PT Time Calculation (min) 50 min   Activity Tolerance Patient tolerated treatment well   Behavior During Therapy [facility] for tasks assessed/performed      No past medical history on file.  No past surgical history on file.  There were no vitals filed for this visit.  Visit Diagnosis:  Lymphedema  Leg heaviness      Subjective Assessment - 10/12/15 1428    Subjective Pt states it is a hassle wearing her garments to work and feels she may get in trouble eventually for wearing crocs.  Pt states her foot continues to be her main problem .   Currently in Pain? No/denies                         [facility] Adult PT Treatment/Exercise - 10/12/15 1429    Manual Therapy   Manual Therapy Manual Lymphatic Drainage (MLD)   Manual therapy comments compression bandaging to Lt LE including toe wraps.  #7 netting, 1/2" foam, 5 layers short stretch    Manual Lymphatic Drainage (MLD) supraclavicular, deep and superficial abdominal followed by routing using inguinal axillary anastomosist and Lt LE hip to toes.  Postereior completed in prone.                  PT Short Term Goals - 10/07/15 1238    PT SHORT TERM GOAL #1   Title Pt to be I in skin care and care of bandages   Time 1   Period Weeks   Status Achieved   PT SHORT TERM GOAL #2   Title Pt to verbalize precautions for decreasing the risk of infection    Time 1   Period Weeks   Status Achieved   PT SHORT TERM GOAL #3    Title Pt to be I in a Home exercise program to promote lymphatic circulation    Time 1   Period Weeks   Status Achieved   PT SHORT TERM GOAL #4   Title Pt volume to be decreased by 50%   Time 2   Period Weeks   Status On-going           PT Long Term Goals - 10/07/15 1238    PT LONG TERM GOAL #1   Title Pt to edema to be reduced by 80% to reduce risk of cellulitis.    Time 6   Period Weeks   Status Revised   PT LONG TERM GOAL #2   Title Pt Lymphedma life impact score to decrease by 30 points    Time 6   Period Weeks   Status Revised   PT LONG TERM GOAL #3   Title Pt to verbalize understanding of the maintenance phase of treatment   Time 4   Period Weeks   Status On-going               Plan - 10/12/15 1435  Clinical Impression Statement Blistered area is now healed on dorsum of foot.  Pt continues to have difficulty with bandaging technique ,mainly the roman sandal, order of HAS and technique of spiraling vs herringbone.  Toe bandaging completed by therapist prior to self bandaging and instruction.  Pt with questions regarding compression hose.  States she was wearing hose from [employer] compression outlet prior to beginning therapy and did not seem to compress as much as she needed in the foot.  Pt may benefit from customs due to the concentrated swelling in the foot.  Will discuss with therapist and contact [facility] if needed/send referral to MD>    PT Next Visit Plan Continue manual lymph drainage and bandage education/training.  Follow up with compression garment consult.        Problem List There are no active problems to display for this patient.   Lurena Nidamy B Ayiden Milliman, PTA/CLT [phone]  10/12/2015, 2:40 PM  [facility] [address] [place], [place], [address] Phone: [phone]   Fax:  [phone]  Name: Misty Glenn MRN: [id number] Date of Birth: 28-Jul-1971

## 2015-10-20 ENCOUNTER — Encounter (HOSPITAL_COMMUNITY): Payer: BC Managed Care – PPO | Admitting: Physical Therapy

## 2015-10-20 ENCOUNTER — Ambulatory Visit (HOSPITAL_COMMUNITY): Payer: BC Managed Care – PPO | Admitting: Physical Therapy

## 2015-10-20 DIAGNOSIS — R29898 Other symptoms and signs involving the musculoskeletal system: Secondary | ICD-10-CM

## 2015-10-20 DIAGNOSIS — I890 Lymphedema, not elsewhere classified: Secondary | ICD-10-CM | POA: Diagnosis not present

## 2015-10-20 NOTE — Therapy (Signed)
[facility] [address] [place], [place], [address] Phone: [phone]   Fax:  [phone]  Physical Therapy Treatment  Patient Details  Name: Misty MollyChandra L Delapaz MRN: [id number] Date of Birth: Aug 23, 1971 Referring Provider: Dr. Ouida SillsFagan  Encounter Date: 10/20/2015      PT End of Session - 10/20/15 1446    Visit Number 13   Number of Visits 18   Date for PT Re-Evaluation 11/05/15   Authorization Type BCBS   PT Start Time 1345   PT Stop Time 1430   PT Time Calculation (min) 45 min   Activity Tolerance Patient tolerated treatment well   Behavior During Therapy [facility] for tasks assessed/performed      No past medical history on file.  No past surgical history on file.  There were no vitals filed for this visit.  Visit Diagnosis:  Lymphedema  Leg heaviness      Subjective Assessment - 10/20/15 1445    Subjective Pt states she had another blister come up on her dorsal forefoot.  Now healed but admits to not wearing compression full time.  No pain reported   Currently in Pain? No/denies                         [facility] Treatment/Exercise - 10/20/15 1446    Manual Therapy   Manual Therapy Manual Lymphatic Drainage (MLD)   Manual therapy comments compression bandaging to Lt LE including toe wraps.  #7 netting, 1/2" foam, 5 layers short stretch    Manual Lymphatic Drainage (MLD) supraclavicular, deep and superficial abdominal followed by routing using inguinal axillary anastomosist and Lt LE hip to toes.  Postereior completed in prone.                PT Education - 10/20/15 1449    Education provided Yes   Education Details Importance of keeping compression on LE at all times when up   Person(s) Educated Patient   Methods Explanation   Comprehension Need further instruction;Verbalized understanding          PT Short Term Goals - 10/07/15 1238    PT SHORT TERM GOAL #1   Title Pt to be I in  skin care and care of bandages   Time 1   Period Weeks   Status Achieved   PT SHORT TERM GOAL #2   Title Pt to verbalize precautions for decreasing the risk of infection    Time 1   Period Weeks   Status Achieved   PT SHORT TERM GOAL #3   Title Pt to be I in a Home exercise program to promote lymphatic circulation    Time 1   Period Weeks   Status Achieved   PT SHORT TERM GOAL #4   Title Pt volume to be decreased by 50%   Time 2   Period Weeks   Status On-going           PT Long Term Goals - 10/07/15 1238    PT LONG TERM GOAL #1   Title Pt to edema to be reduced by 80% to reduce risk of cellulitis.    Time 6   Period Weeks   Status Revised   PT LONG TERM GOAL #2   Title Pt Lymphedma life impact score to decrease by 30 points    Time 6   Period Weeks   Status Revised   PT LONG TERM GOAL #3  Title Pt to verbalize understanding of the maintenance phase of treatment   Time 4   Period Weeks   Status On-going               Plan - 10/20/15 1446    Clinical Impression Statement Explained importance to patient of keepig compression on foot/LE all all times.  Also importance of the ROman sandal and HAS techniques to ensure even compression and prevent blistering.   Manual lymph draiange completed then Therapist completed all bandaging today including toe bandaging.  Orders received for new garment and faxed to [facility].  Pt may also benefit from pump or nighttime garments.     PT Next Visit Plan Continue manual lymph drainage and bandage education/training.  Follow up with compression garment consult.        Problem List There are no active problems to display for this patient.   Lurena Nida, PTA/CLT [phone]  10/20/2015, 2:50 PM  [facility] [address] [place], [place], [address] Phone: [phone]   Fax:  [phone]  Name: AYSE MCCARTIN MRN: [id number] Date of Birth:  [birth year]

## 2015-10-22 ENCOUNTER — Ambulatory Visit (HOSPITAL_COMMUNITY): Payer: BC Managed Care – PPO | Attending: Internal Medicine | Admitting: Physical Therapy

## 2015-10-22 DIAGNOSIS — I890 Lymphedema, not elsewhere classified: Secondary | ICD-10-CM | POA: Diagnosis present

## 2015-10-22 DIAGNOSIS — R29898 Other symptoms and signs involving the musculoskeletal system: Secondary | ICD-10-CM | POA: Diagnosis present

## 2015-10-22 NOTE — Therapy (Signed)
[facility] [address] [place], [place], [address] Phone: [phone]   Fax:  [phone]  Physical Therapy Treatment  Patient Details  Name: Misty Glenn MRN: [id number] Date of Birth: 21-Jun-1971 Referring Provider: Dr. Ouida Sills  Encounter Date: 10/22/2015      PT End of Session - 10/22/15 0933    Visit Number 14   Number of Visits 18   Date for PT Re-Evaluation 11/05/15   Authorization Type BCBS   PT Start Time 0802   PT Stop Time 0846   PT Time Calculation (min) 44 min      No past medical history on file.  No past surgical history on file.  There were no vitals filed for this visit.  Visit Diagnosis:  Lymphedema  Leg heaviness      Subjective Assessment - 10/22/15 0931    Subjective Pt states the bandages never hurt when therapist places them on but when she does it herself she has problems    Currently in Pain? No/denies                         [facility] Adult PT Treatment/Exercise - 10/22/15 0932    Manual Therapy   Manual Therapy Manual Lymphatic Drainage (MLD)   Manual therapy comments compression bandaging to Lt LE including toe wraps.  #7 netting, 1/2" foam, 5 layers short stretch    Manual Lymphatic Drainage (MLD) supraclavicular, deep and superficial abdominal followed by routing using inguinal axillary anastomosist and Lt LE hip to toes.  Postereior completed in prone.                  PT Short Term Goals - 10/07/15 1238    PT SHORT TERM GOAL #1   Title Pt to be I in skin care and care of bandages   Time 1   Period Weeks   Status Achieved   PT SHORT TERM GOAL #2   Title Pt to verbalize precautions for decreasing the risk of infection    Time 1   Period Weeks   Status Achieved   PT SHORT TERM GOAL #3   Title Pt to be I in a Home exercise program to promote lymphatic circulation    Time 1   Period Weeks   Status Achieved   PT SHORT TERM GOAL #4   Title Pt volume to be  decreased by 50%   Time 2   Period Weeks   Status On-going           PT Long Term Goals - 10/07/15 1238    PT LONG TERM GOAL #1   Title Pt to edema to be reduced by 80% to reduce risk of cellulitis.    Time 6   Period Weeks   Status Revised   PT LONG TERM GOAL #2   Title Pt Lymphedma life impact score to decrease by 30 points    Time 6   Period Weeks   Status Revised   PT LONG TERM GOAL #3   Title Pt to verbalize understanding of the maintenance phase of treatment   Time 4   Period Weeks   Status On-going               Plan - 10/22/15 0934    Clinical Impression Statement Pt encouraged to practice wrapping multiple times.  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  Noted decreased congetion in foot.    PT Next Visit Plan Re-measure.  Ask pt if she could talk to supervisor and if she has a note can she weaar open shoes at work.         Problem List There are no active problems to display for this patient.   Virgina OrganCynthia Kortlyn Koltz, PT CLT [phone] 10/22/2015, 9:37 AM  [facility] [address] [place], [place], [address] Phone: [phone]   Fax:  [phone]  Name: Jimmie MollyChandra L Symonette MRN: [id number] Date of Birth: Apr 03, 1971

## 2015-10-27 ENCOUNTER — Encounter (HOSPITAL_COMMUNITY): Payer: BC Managed Care – PPO | Admitting: Physical Therapy

## 2015-10-28 ENCOUNTER — Ambulatory Visit (HOSPITAL_COMMUNITY): Payer: BC Managed Care – PPO | Admitting: Physical Therapy

## 2015-10-28 DIAGNOSIS — I890 Lymphedema, not elsewhere classified: Secondary | ICD-10-CM

## 2015-10-28 DIAGNOSIS — R29898 Other symptoms and signs involving the musculoskeletal system: Secondary | ICD-10-CM

## 2015-10-28 NOTE — Therapy (Signed)
[facility] [address] [place], [place], [address] Phone: [phone]   Fax:  [phone]  Physical Therapy Treatment  Patient Details  Name: Misty Glenn MRN: [id number] Date of Birth: Jun 01, 1971 Referring Provider: Dr. Ouida SillsFagan  Encounter Date: 10/28/2015      PT End of Session - 10/28/15 1510    Visit Number 15   Number of Visits 18   Date for PT Re-Evaluation 11/05/15   Authorization Type BCBS   PT Start Time 1415   PT Stop Time 1500   PT Time Calculation (min) 45 min   Activity Tolerance Patient tolerated treatment well   Behavior During Therapy [facility] for tasks assessed/performed      No past medical history on file.  No past surgical history on file.  There were no vitals filed for this visit.  Visit Diagnosis:  Lymphedema  Leg heaviness      Subjective Assessment - 10/28/15 1505    Subjective Pt states she was measured for her stockings this week.  States she is not having any pain or difficulties and is overall pleased with her decongestion.   Currently in Pain? No/denies   Pain Score 0-No pain                         [facility] Adult PT Treatment/Exercise - 10/28/15 1505    Manual Therapy   Manual Therapy Manual Lymphatic Drainage (MLD)   Manual therapy comments compression bandaging to Lt LE including toe wraps.  #7 netting, 1/2" foam, 5 layers short stretch    Manual Lymphatic Drainage (MLD) supraclavicular, deep and superficial abdominal followed by routing using inguinal axillary anastomosist and Lt LE hip to toes.  Postereior completed in prone.          Date 09/08/2015  09/23/2015 17-Nov 10/28/2015   Rt Lt Lt Lt Lt  MTP 22.30 26.50 25 27.2 23.4  ankle 25 29.3 31.00 29.00 29.50  4cm 24.20 27.80 27.30 28.10 27.00  8cm 27.50 29.30 28.20 28.70 28.40  12 cm 30.00 30.90 30.00 29.50 30.00  16cm 29.70 33.00 32.00 30.80 31.30  20cm 36.80 36.70 34.30 33.50 33.50  24cm 37.80 39.30 37.80 37.50  36.80  28cm 40.00 40.40 39.00 40.20 38.40  32cm 38.70 39.80 38.50 41.00 38.00  36cm 39.70 39.20 38.00 41.20 40.00  40cm       44cm       48cm                                          Sum of squares 11703.13 12880.10 12103.11 12536.01 11828.11  Total Volume 3725.2233 4099.865 3852.541 3990.337 3765.006             PT Short Term Goals - 10/07/15 1238    PT SHORT TERM GOAL #1   Title Pt to be I in skin care and care of bandages   Time 1   Period Weeks   Status Achieved   PT SHORT TERM GOAL #2   Title Pt to verbalize precautions for decreasing the risk of infection    Time 1   Period Weeks   Status Achieved   PT SHORT TERM GOAL #3   Title Pt to be I in a Home exercise program to promote lymphatic circulation    Time 1   Period Weeks   Status Achieved  PT SHORT TERM GOAL #4   Title Pt volume to be decreased by 50%   Time 2   Period Weeks   Status On-going           PT Long Term Goals - 10/07/15 1238    PT LONG TERM GOAL #1   Title Pt to edema to be reduced by 80% to reduce risk of cellulitis.    Time 6   Period Weeks   Status Revised   PT LONG TERM GOAL #2   Title Pt Lymphedma life impact score to decrease by 30 points    Time 6   Period Weeks   Status Revised   PT LONG TERM GOAL #3   Title Pt to verbalize understanding of the maintenance phase of treatment   Time 4   Period Weeks   Status On-going               Plan - 10/28/15 1510    Clinical Impression Statement Pt remeasured today wtih overall decongestion to near normal of Rt LE.  Pt should be getting her garments in the next couple weeks so will continue therapy until receives.  Pt has progressed very well.   PT Next Visit Plan continue CLT until patient receives garments.        Problem List There are no active problems to display for this patient.  Lurena Nida, PTA/CLT [phone] 10/28/2015, 3:12 PM  [facility] [address]  [place], [place], [address] Phone: [phone]   Fax:  [phone]  Name: Misty Glenn MRN: [id number] Date of Birth: October 10, 1971

## 2015-11-01 ENCOUNTER — Other Ambulatory Visit (HOSPITAL_COMMUNITY): Payer: Self-pay | Admitting: Internal Medicine

## 2015-11-01 DIAGNOSIS — Z1231 Encounter for screening mammogram for malignant neoplasm of breast: Secondary | ICD-10-CM

## 2015-11-02 ENCOUNTER — Ambulatory Visit (HOSPITAL_COMMUNITY): Payer: BC Managed Care – PPO | Admitting: Physical Therapy

## 2015-11-02 ENCOUNTER — Encounter (HOSPITAL_COMMUNITY): Payer: BC Managed Care – PPO | Admitting: Physical Therapy

## 2015-11-02 DIAGNOSIS — I890 Lymphedema, not elsewhere classified: Secondary | ICD-10-CM

## 2015-11-02 DIAGNOSIS — R29898 Other symptoms and signs involving the musculoskeletal system: Secondary | ICD-10-CM

## 2015-11-02 NOTE — Therapy (Addendum)
[facility] [address] [place], [place], [address] Phone: [phone]   Fax:  [phone]  Physical Therapy Treatment  Patient Details  Name: Misty MollyChandra L Hammonds MRN: [id number] Date of Birth: May 05, 1971 Referring Provider: Dr. Ouida SillsFagan  Encounter Date: 11/02/2015      PT End of Session - 11/02/15 1008    Visit Number 16   Number of Visits 18   Date for PT Re-Evaluation 11/05/15   Authorization Type BCBS   PT Start Time [phone]   PT Stop Time 0930   PT Time Calculation (min) 43 min   Activity Tolerance Patient tolerated treatment well   Behavior During Therapy [facility] for tasks assessed/performed      No past medical history on file.  No past surgical history on file.  There were no vitals filed for this visit.  Visit Diagnosis:  Lymphedema  Leg heaviness      Subjective Assessment - 11/02/15 1006    Subjective Pt is very pleased with her reduction.   States her leg has not been this small in years.    Currently in Pain? No/denies              [facility]/Exercise - 11/02/15 1007    Manual Therapy   Manual Therapy Manual Lymphatic Drainage (MLD)   Manual therapy comments compression bandaging to Lt LE including toe wraps.and multilayer short stretch bandages    Manual Lymphatic Drainage (MLD) supraclavicular, deep and superficial abdominal followed by routing using inguinal axillary anastomosist and Lt LE hip to toes.  Postereior completed in prone.             PT Short Term Goals - 10/07/15 1238    PT SHORT TERM GOAL #1   Title Pt to be I in skin care and care of bandages   Time 1   Period Weeks   Status Achieved   PT SHORT TERM GOAL #2   Title Pt to verbalize precautions for decreasing the risk of infection    Time 1   Period Weeks   Status Achieved   PT SHORT TERM GOAL #3   Title Pt to be I in a Home exercise program to promote lymphatic circulation    Time 1   Period Weeks   Status  Achieved   PT SHORT TERM GOAL #4   Title Pt volume to be decreased by 50%   Time 2   Period Weeks   Status On-going           PT Long Term Goals - 10/07/15 1238    PT LONG TERM GOAL #1   Title Pt to edema to be reduced by 80% to reduce risk of cellulitis.    Time 6   Period Weeks   Status Revised   PT LONG TERM GOAL #2   Title Pt Lymphedma life impact score to decrease by 30 points    Time 6   Period Weeks   Status Revised   PT LONG TERM GOAL #3   Title Pt to verbalize understanding of the maintenance phase of treatment   Time 4   Period Weeks   Status On-going      Assessment:   Pt Lt LE volume is approaching Rt. Pt will continue to benefit from skilled theapy for manual decongestive techniques and multi-layer bandaging to keep volume decreased until pt receives her garment. PLAN:  Continue decongestive techniques until garment is received.     Problem List  There are no active problems to display for this patient.   Virgina Organ, PT CLT [phone] 11/02/2015, 10:09 AM  [facility] [address] [place], [place], [address] Phone: [phone]   Fax:  [phone]  Name: INESSA WARDROP MRN: [id number] Date of Birth: 11/01/1971

## 2015-11-04 ENCOUNTER — Ambulatory Visit (HOSPITAL_COMMUNITY): Payer: BC Managed Care – PPO | Admitting: Physical Therapy

## 2015-11-04 ENCOUNTER — Ambulatory Visit (HOSPITAL_COMMUNITY)
Admission: RE | Admit: 2015-11-04 | Discharge: 2015-11-04 | Disposition: A | Discharge status: Home / Self Care | Payer: BC Managed Care – PPO | Source: Ambulatory Visit | Attending: Internal Medicine | Admitting: Internal Medicine

## 2015-11-04 DIAGNOSIS — R29898 Other symptoms and signs involving the musculoskeletal system: Secondary | ICD-10-CM

## 2015-11-04 DIAGNOSIS — I890 Lymphedema, not elsewhere classified: Secondary | ICD-10-CM

## 2015-11-04 DIAGNOSIS — Z1231 Encounter for screening mammogram for malignant neoplasm of breast: Secondary | ICD-10-CM | POA: Insufficient documentation

## 2015-11-04 NOTE — Therapy (Signed)
[facility] [address] [place], [place], [address] Phone: [phone]   Fax:  [phone]  Physical Therapy Treatment  Patient Details  Name: Misty Glenn MRN: [id number] Date of Birth: May 07, 1971 Referring Provider: Dr. Ouida SillsFagan  Encounter Date: 11/04/2015      PT End of Session - 11/04/15 1429    Visit Number 17   Number of Visits 18   Date for PT Re-Evaluation 11/05/15   Authorization Type BCBS   PT Start Time 1345   PT Stop Time 1425   PT Time Calculation (min) 40 min   Activity Tolerance Patient tolerated treatment well   Behavior During Therapy [facility] for tasks assessed/performed      No past medical history on file.  No past surgical history on file.  There were no vitals filed for this visit.  Visit Diagnosis:  Lymphedema  Leg heaviness      Subjective Assessment - 11/04/15 1427    Subjective PT states that she thinks that  her Lt leg is smaller than her Rt leg.  Waiting on her compression garment.    Currently in Pain? No/denies                         [facility] Treatment/Exercise - 11/04/15 0001    Manual Therapy   Manual Therapy Manual Lymphatic Drainage (MLD)   Manual therapy comments compression bandaging to Lt LE including toe wraps.and multilayer short stretch bandages    Manual Lymphatic Drainage (MLD) supraclavicular, deep and superficial abdominal followed by routing using inguinal axillary anastomosist and Lt LE hip to toes.  Postereior completed in prone.                  PT Short Term Goals - 10/07/15 1238    PT SHORT TERM GOAL #1   Title Pt to be I in skin care and care of bandages   Time 1   Period Weeks   Status Achieved   PT SHORT TERM GOAL #2   Title Pt to verbalize precautions for decreasing the risk of infection    Time 1   Period Weeks   Status Achieved   PT SHORT TERM GOAL #3   Title Pt to be I in a Home exercise program to promote lymphatic  circulation    Time 1   Period Weeks   Status Achieved   PT SHORT TERM GOAL #4   Title Pt volume to be decreased by 50%   Time 2   Period Weeks   Status On-going           PT Long Term Goals - 10/07/15 1238    PT LONG TERM GOAL #1   Title Pt to edema to be reduced by 80% to reduce risk of cellulitis.    Time 6   Period Weeks   Status Revised   PT LONG TERM GOAL #2   Title Pt Lymphedma life impact score to decrease by 30 points    Time 6   Period Weeks   Status Revised   PT LONG TERM GOAL #3   Title Pt to verbalize understanding of the maintenance phase of treatment   Time 4   Period Weeks   Status On-going               Plan - 11/04/15 1429    Clinical Impression Statement Pt continues to be pleased with reduction of volume in her  LE.  Pt will continue to benefit from skilled decongestive and bandaging techniques to ensure volume stays down until garment arrives.    PT Next Visit Plan re-measure.  D/C if garment is in.  Will need recertification.         Problem List There are no active problems to display for this patient.   Virgina Organ, PT CLT [phone] 11/04/2015, 2:32 PM  [facility] [address] [place], [place], [address] Phone: [phone]   Fax:  [phone]  Name: Misty Glenn MRN: [id number] Date of Birth: 1970/12/20

## 2015-11-24 ENCOUNTER — Ambulatory Visit (HOSPITAL_COMMUNITY): Payer: BC Managed Care – PPO | Admitting: Physical Therapy

## 2016-01-01 ENCOUNTER — Encounter (HOSPITAL_COMMUNITY): Payer: Self-pay | Admitting: Emergency Medicine

## 2016-01-01 ENCOUNTER — Emergency Department (HOSPITAL_COMMUNITY)
Admission: EM | Admit: 2016-01-01 | Discharge: 2016-01-01 | Disposition: A | Discharge status: Home / Self Care | Payer: BC Managed Care – PPO | Attending: Emergency Medicine | Admitting: Emergency Medicine

## 2016-01-01 DIAGNOSIS — H578 Other specified disorders of eye and adnexa: Secondary | ICD-10-CM | POA: Diagnosis present

## 2016-01-01 DIAGNOSIS — Z8679 Personal history of other diseases of the circulatory system: Secondary | ICD-10-CM | POA: Diagnosis not present

## 2016-01-01 DIAGNOSIS — H18822 Corneal disorder due to contact lens, left eye: Secondary | ICD-10-CM | POA: Insufficient documentation

## 2016-01-01 HISTORY — DX: Lymphedema, not elsewhere classified: I89.0

## 2016-01-01 MED ORDER — FLUORESCEIN SODIUM 1 MG OP STRP
ORAL_STRIP | OPHTHALMIC | Status: AC
  Administered 2016-01-01: 18:00:00
  Filled 2016-01-01: qty 1

## 2016-01-01 MED ORDER — TETRACAINE HCL 0.5 % OP SOLN
1.0000 [drp] | Freq: Once | OPHTHALMIC | Status: AC
  Administered 2016-01-01: 1 [drp] via OPHTHALMIC
  Filled 2016-01-01: qty 4

## 2016-01-01 MED ORDER — FLUORESCEIN-BENOXINATE 0.25-0.4 % OP SOLN
1.0000 [drp] | Freq: Once | OPHTHALMIC | Status: AC
  Administered 2016-01-01: 1 [drp] via OPHTHALMIC
  Filled 2016-01-01: qty 5

## 2016-01-01 MED ORDER — ERYTHROMYCIN 5 MG/GM OP OINT
TOPICAL_OINTMENT | Freq: Once | OPHTHALMIC | Status: AC
  Administered 2016-01-01: 1 via OPHTHALMIC
  Filled 2016-01-01: qty 3.5

## 2016-01-01 NOTE — ED Provider Notes (Signed)
CSN: [id number]     Arrival date & time 01/01/16  1600 History   First MD Initiated Contact with Patient 01/01/16 1713     Chief Complaint  Patient presents with  . Eye Problem     (Consider location/radiation/quality/duration/timing/severity/associated sxs/prior Treatment) Patient is a 45 y.o. female presenting with eye problem. The history is provided by the patient.  Eye Problem Quality:  Burning, foreign body sensation and tearing Severity:  Moderate Onset quality:  Sudden Duration:  1 day Timing:  Constant Progression:  Worsening Chronicity:  New Context: contact lenses   Relieved by:  Nothing Worsened by:  Bright light and contact lenses Ineffective treatments:  Darkened room, flushing, sunglasses and sleep Associated symptoms: discharge, foreign body sensation, inflammation, photophobia, redness and tearing    Misty Glenn is a 45 y.o. female who presents to the ED with left eye redness, drainage and irritation. Patient reports that she was driving and had her contact lenses in and felt irritation of the left eye. She went home, took her contacts out and rinsed her eye but it continued to feel scratchy. She slept for a while and now has crusting of the eye lids and continued irritation of the left eye.    Past Medical History  Diagnosis Date  . Lymphedema    Past Surgical History  Procedure Laterality Date  . Abdominal hysterectomy     History reviewed. No pertinent family history. Social History  Substance Use Topics  . Smoking status: Never Smoker   . Smokeless tobacco: None  . Alcohol Use: No   OB History    No data available     Review of Systems  Eyes: Positive for photophobia, discharge and redness.  all other systems negative    Allergies  Compazine  Home Medications   Prior to Admission medications   Not on File   BP 168/86 mmHg  Pulse 74  Temp(Src) 97.7 F (36.5 C) (Oral)  Resp 18  Ht  (1.575 m)  Wt 86.183 kg  BMI 34.74  kg/m2  SpO2 100% Physical Exam  Constitutional: She is oriented to person, place, and time. She appears well-developed and well-nourished. No distress.  HENT:  Head: Normocephalic.  Eyes: EOM are normal. Lids are everted and swept, no foreign bodies found. Left eye exhibits exudate. Left conjunctiva is injected.  Slit lamp exam:      The left eye shows corneal abrasion and fluorescein uptake. The left eye shows no foreign body and no hyphema.    Corneal abrasion noted at 12 o'clock.  Neck: Normal range of motion. Neck supple.  Cardiovascular: Normal rate.   Pulmonary/Chest: Effort normal.  Musculoskeletal: Normal range of motion.  Neurological: She is alert and oriented to person, place, and time. No cranial nerve deficit.  Skin: Skin is warm and dry.  Psychiatric: She has a normal mood and affect. Her behavior is normal.  Nursing note and vitals reviewed.   ED Course  Procedures  Visual acuity Erythromycin Opth. Ointment to left eye  MDM  45 y.o. female with left eye irritation and foreign body sensation that started while wearing her contact lenses. Stable for d/c without foreign body noted, no double vision. Antibiotic eye ointment and follow up with Opthalmology. Patient declined narcotic pain medication and states she will take tylenol and ibuprofen.   Final diagnoses:  Corneal abrasion due to contact lens, left        [facility], NP 01/01/16 1756  Raeford Razor, MD 01/04/16  1343 

## 2016-01-01 NOTE — ED Notes (Signed)
Pt c/o left eye drainage, pain, redness, and swelling since this morning.

## 2016-01-01 NOTE — Discharge Instructions (Signed)
Use the eye ointment 3 times a day for the next week.  Call your eye doctor on Monday for follow up.  Return here for worsening symptoms.

## 2016-08-29 IMAGING — MG MM DIGITAL SCREENING
7 series · 7 of 7 positions shown · non-contrast
Comparison: Previous exam(s).

CLINICAL DATA: Screening.

EXAM:
DIGITAL SCREENING BILATERAL MAMMOGRAM WITH CAD

[R CC]
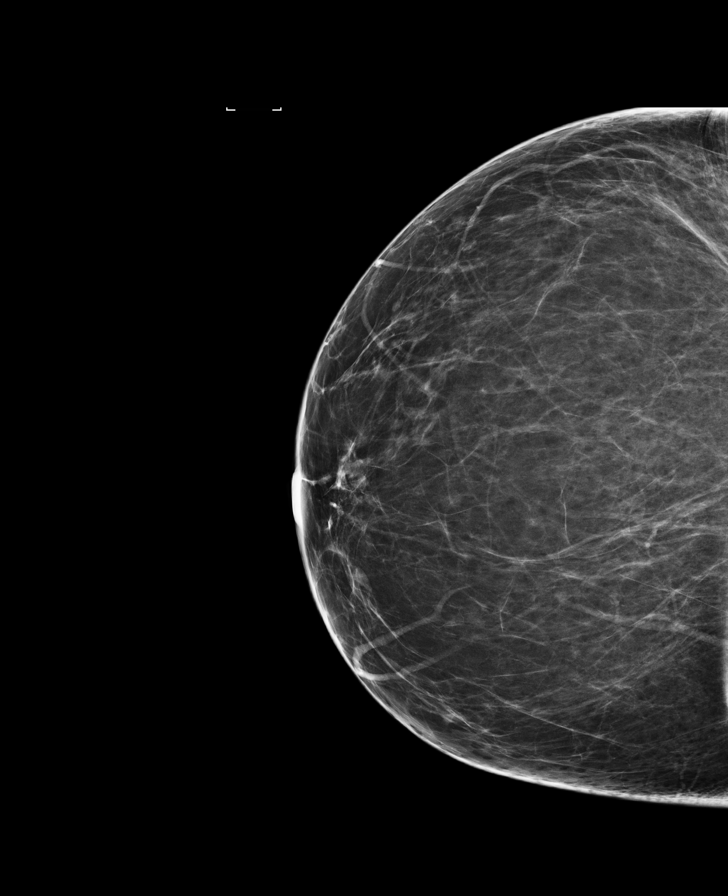

[L MLO (1 of 2)]
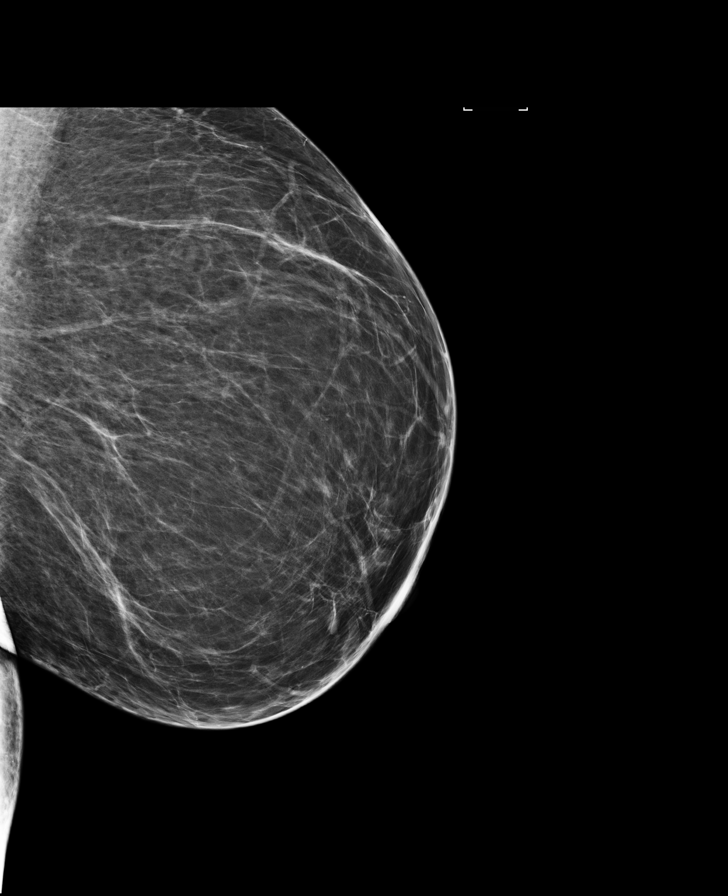

[R MLO (1 of 3)]
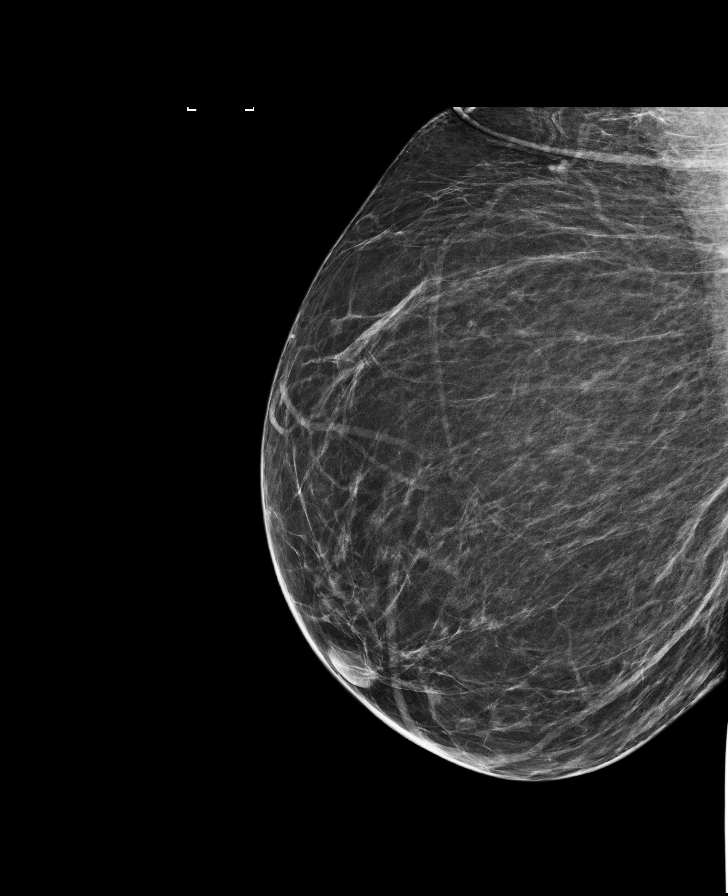

[R MLO (2 of 3)]
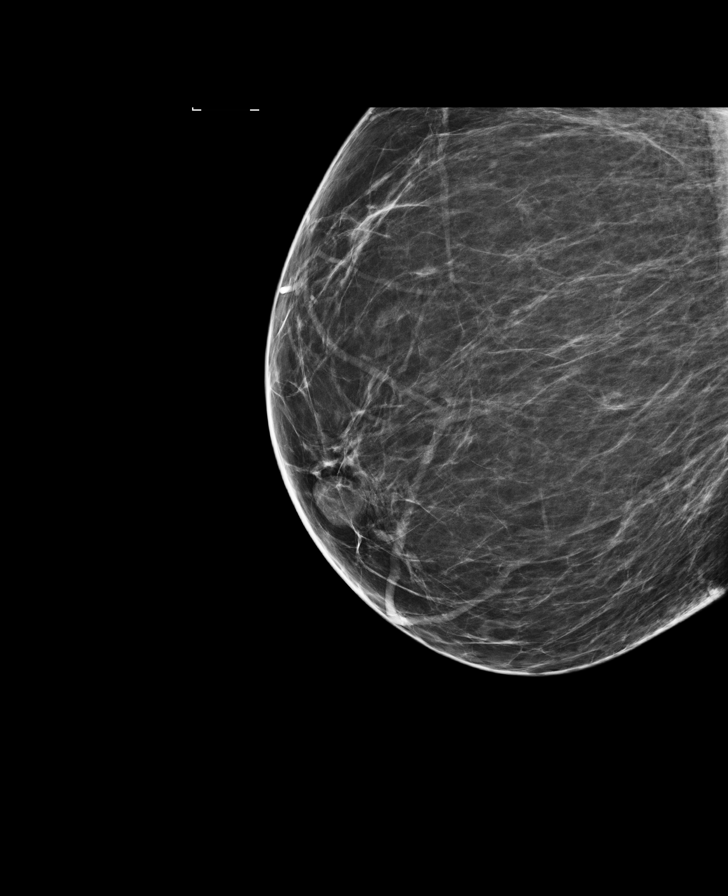

[L MLO (2 of 2)]
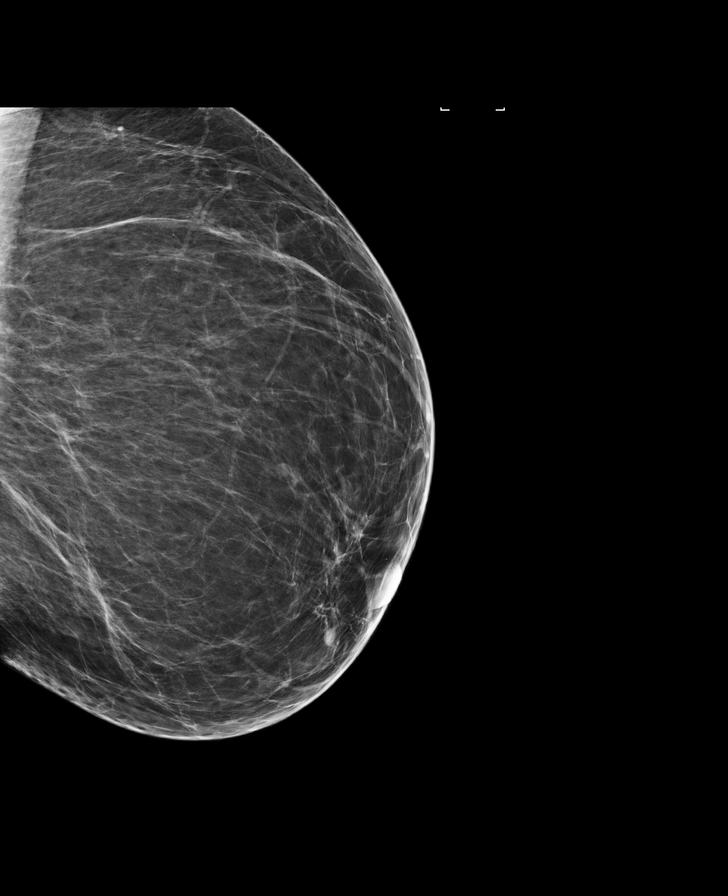

[R MLO (3 of 3)]
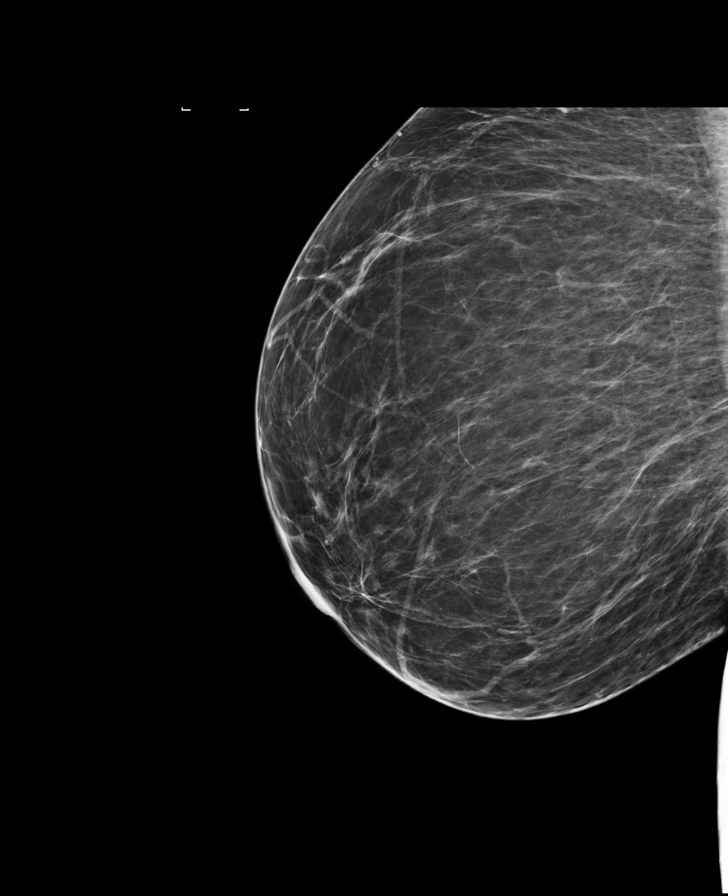

[L CC]
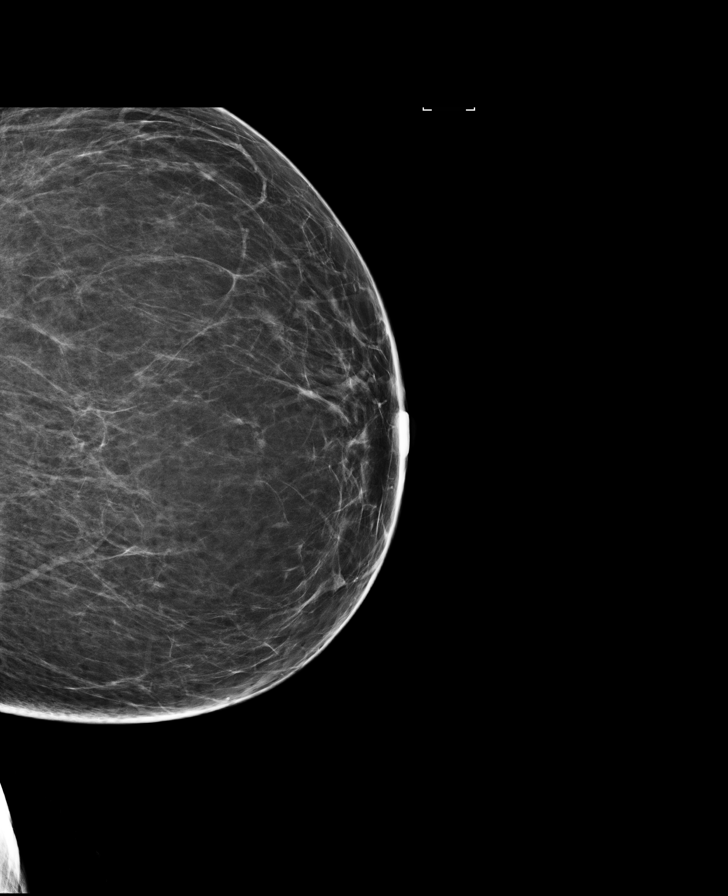

[7 of 7 positions shown; findings below may reference images not displayed]

ACR Breast Density Category b: There are scattered areas of
fibroglandular density.
FINDINGS: There are no findings suspicious for malignancy. Images were
processed with CAD.
IMPRESSION: No mammographic evidence of malignancy. A result letter of this
screening mammogram will be mailed directly to the patient.

RECOMMENDATION:
Screening mammogram in one year. (Code:AS-G-LCT)

BI-RADS CATEGORY  1: Negative.

## 2016-11-15 ENCOUNTER — Other Ambulatory Visit (HOSPITAL_COMMUNITY): Payer: Self-pay | Admitting: Internal Medicine

## 2016-11-15 DIAGNOSIS — Z1231 Encounter for screening mammogram for malignant neoplasm of breast: Secondary | ICD-10-CM

## 2016-11-16 ENCOUNTER — Ambulatory Visit (HOSPITAL_COMMUNITY)
Admission: RE | Admit: 2016-11-16 | Discharge: 2016-11-16 | Disposition: A | Discharge status: Home / Self Care | Payer: BC Managed Care – PPO | Source: Ambulatory Visit | Attending: Internal Medicine | Admitting: Internal Medicine

## 2016-11-16 DIAGNOSIS — Z1231 Encounter for screening mammogram for malignant neoplasm of breast: Secondary | ICD-10-CM

## 2017-09-28 ENCOUNTER — Other Ambulatory Visit (HOSPITAL_COMMUNITY): Payer: Self-pay | Admitting: Internal Medicine

## 2017-09-28 DIAGNOSIS — Z1231 Encounter for screening mammogram for malignant neoplasm of breast: Secondary | ICD-10-CM

## 2017-11-22 ENCOUNTER — Ambulatory Visit (HOSPITAL_COMMUNITY): Payer: BC Managed Care – PPO

## 2017-11-30 ENCOUNTER — Other Ambulatory Visit (HOSPITAL_COMMUNITY): Payer: Self-pay | Admitting: Internal Medicine

## 2017-11-30 ENCOUNTER — Ambulatory Visit (HOSPITAL_COMMUNITY)
Admission: RE | Admit: 2017-11-30 | Discharge: 2017-11-30 | Disposition: A | Discharge status: Home / Self Care | Payer: BC Managed Care – PPO | Source: Ambulatory Visit | Attending: Internal Medicine | Admitting: Internal Medicine

## 2017-11-30 DIAGNOSIS — Z1231 Encounter for screening mammogram for malignant neoplasm of breast: Secondary | ICD-10-CM

## 2018-07-19 ENCOUNTER — Other Ambulatory Visit (HOSPITAL_COMMUNITY): Payer: Self-pay | Admitting: Internal Medicine

## 2018-07-19 ENCOUNTER — Ambulatory Visit (HOSPITAL_COMMUNITY)
Admission: RE | Admit: 2018-07-19 | Discharge: 2018-07-19 | Disposition: A | Discharge status: Home / Self Care | Payer: BC Managed Care – PPO | Source: Ambulatory Visit | Attending: Internal Medicine | Admitting: Internal Medicine

## 2018-07-19 DIAGNOSIS — M7989 Other specified soft tissue disorders: Secondary | ICD-10-CM | POA: Diagnosis present

## 2018-07-19 DIAGNOSIS — M79661 Pain in right lower leg: Secondary | ICD-10-CM | POA: Insufficient documentation

## 2018-07-19 DIAGNOSIS — R52 Pain, unspecified: Secondary | ICD-10-CM

## 2018-07-19 DIAGNOSIS — M79671 Pain in right foot: Secondary | ICD-10-CM | POA: Insufficient documentation

## 2018-07-19 DIAGNOSIS — R609 Edema, unspecified: Secondary | ICD-10-CM

## 2018-10-30 ENCOUNTER — Other Ambulatory Visit (HOSPITAL_COMMUNITY): Payer: Self-pay | Admitting: Internal Medicine

## 2018-10-30 DIAGNOSIS — Z1231 Encounter for screening mammogram for malignant neoplasm of breast: Secondary | ICD-10-CM

## 2021-01-03 ENCOUNTER — Other Ambulatory Visit (HOSPITAL_COMMUNITY): Payer: Self-pay | Admitting: Internal Medicine

## 2021-01-03 DIAGNOSIS — Z1231 Encounter for screening mammogram for malignant neoplasm of breast: Secondary | ICD-10-CM

## 2021-01-13 ENCOUNTER — Ambulatory Visit (HOSPITAL_COMMUNITY): Payer: Self-pay

## 2022-01-09 ENCOUNTER — Encounter: Payer: Self-pay | Admitting: Dietician

## 2022-01-09 ENCOUNTER — Encounter: Payer: BC Managed Care – PPO | Attending: Internal Medicine | Admitting: Dietician

## 2022-01-09 ENCOUNTER — Other Ambulatory Visit: Payer: Self-pay

## 2022-01-09 VITALS — Ht 62.0 in | Wt 205.7 lb

## 2022-01-09 DIAGNOSIS — E785 Hyperlipidemia, unspecified: Secondary | ICD-10-CM

## 2022-01-09 DIAGNOSIS — E119 Type 2 diabetes mellitus without complications: Secondary | ICD-10-CM | POA: Diagnosis not present

## 2022-01-09 NOTE — Progress Notes (Signed)
Medical Nutrition Therapy: Visit start time: 1400  end time: 1500  Assessment:  Diagnosis: Type 2 Diabetes Past medical history: hypertension, lymphedema Psychosocial issues/ stress concerns: none  Preferred learning method:  Auditory Visual Hands-on No preference indicated  Current weight: 205.7lbs Height: 5'2" BMI: 37.62  Medications, supplements: reconciled list in medical record; was taking Phentermine but stopped last week.  Progress and evaluation:  Patient reports reducing sugar intake, increasing vegetables. She voices uncertainty and concern over what she should be eating to improve blood sugars  She would like to achieve remission of diabetes, to avoid need for medication and BG testing.  She has been testing BGs about 4pm after sleeping, and reports improvement from > 200, down to 160 yesterday. Might not always be resting for as long as 8 hours prior to testing. Works 3rd shift 7p - 7a, 3-5 nights each week. She sleeps from 8/9 am until 3/4pm. On first day off work she typically stays awake until 9-10pm.  Recent HbA1C 8.6% (01/05/22) leading to diagnosis of Type 2 DM. Other labs: glucose 188, total cholesterol 250, HDL 43. LDL 189, Triglycerides 101 (12/29/21)  Physical activity: semi-active job, works 12 hour shifts 3-5 nights weekly  Dietary Intake:  Usual eating pattern includes 2-3 meals and 1-2 snacks per day. Dining out frequency: 4-5 meals per week.  Breakfast: was eating fast foods after leaving work; today skipped. Usually sleeps about 8/9am - 3/4pm then checks BG Snack: none Lunch: 4pm light salad for past week lettuce and cucumbers only Snack: popcorn, nuts for the past week Supper: 10-11pm salads for past week including chickpeas, lettuce, tomato, cucumber, onion, red/ yellow/ orange bell peppers Snack: same as pm Beverages: water, cirkul sugar free flavored water but stopped last week  Nutrition Care Education: Topics covered:  Basic nutrition: basic food  groups, appropriate nutrient balance, appropriate meal and snack schedule, general nutrition guidelines    Advanced nutrition:  recipe modification, food label reading for carbohydrate Diabetes:  appropriate meal and snack schedule, appropriate carb intake and balance, healthy carb choices, role of fiber, protein; benefits of regular exercise Hypertension: identifying food sources of potassium, magnesium Hyperlipidemia: healthy and unhealthy fats, role of fiber   Nutritional Diagnosis:  [place]-2.2 Altered nutrition-related laboratory As related to Type 2 Diabetes, hyperlipidemia.  As evidenced by elevated HbA1C, total cholesterol ,and LDL. [place]-3.3 Overweight/obesity As related to history of excess calories, inadequate physical activity.  As evidenced by current BMI of 37.6, with patient working on lifestyle changes to improve health and lose weight.  Intervention:  Instruction and discussion as noted above. Patient seems to have significantly reduced carbohydrate intake since her diabetes diagnosis within the past week.  She is motivated to continue making dietary and other lifestyle changes.  Established goals with direction from patient.   Education Materials given:  [employer] guidelines for Diabetes Plate Planner with food lists, sample meal pattern (no specific kcal level) Diabetes Friendly Recipes packet Sample menus Visit summary with goals/ instructions   Learner/ who was taught:  Patient    Level of understanding: Verbalizes/ demonstrates competency   Demonstrated degree of understanding via:   Teach back Learning barriers: None  Willingness to learn/ readiness for change: Eager, change in progress   Monitoring and Evaluation:  Dietary intake, exercise, BG control, blood lipids and BP control, and body weight      follow up:  03/03/22 at 8:15am

## 2022-01-09 NOTE — Patient Instructions (Signed)
Monitor portions of foods especially starchy foods. Keep to fist size (1 cup) or less at any one meal. Include some whole grains such as brown rice, whole wheat bread.  Continue to drink sugar free beverages, try Gatorade zero. Begin some regular exercise, at least 15 minutes or more.

## 2022-01-31 ENCOUNTER — Other Ambulatory Visit: Payer: Self-pay | Admitting: Internal Medicine

## 2022-01-31 DIAGNOSIS — Z1231 Encounter for screening mammogram for malignant neoplasm of breast: Secondary | ICD-10-CM

## 2022-03-03 ENCOUNTER — Encounter: Payer: BC Managed Care – PPO | Attending: Internal Medicine | Admitting: Dietician

## 2022-03-03 ENCOUNTER — Encounter: Payer: Self-pay | Admitting: Dietician

## 2022-03-03 VITALS — Ht 62.0 in | Wt 205.4 lb

## 2022-03-03 DIAGNOSIS — E119 Type 2 diabetes mellitus without complications: Secondary | ICD-10-CM | POA: Diagnosis not present

## 2022-03-03 DIAGNOSIS — E785 Hyperlipidemia, unspecified: Secondary | ICD-10-CM | POA: Insufficient documentation

## 2022-03-03 NOTE — Progress Notes (Signed)
Medical Nutrition Therapy: Visit start time: 0815  end time: 0845  ?Assessment:  Diagnosis: Type 2 diabetes, hyperlipidemia ?Medical history changes: no changes ?Psychosocial issues/ stress concerns: none ? ?Current weight: 205.4   (with extra clothing layers) Height: 5'2" BMI: 37.57 ? ?Medications, supplement changes: reconciled list in medical record ? ?Progress and evaluation:  ?Checks BGs intermittently, reports fasting results 102-122 reading today ?She reports increasing meal prep, drinking more water. ?She has been avoiding cake although eating some choc chip cookies, overall lower sugar intake. She loves Pepsi, but has decreased to 1 can daily, which she sips on most of the day, and combines with lots of ice. ?Made energy bars with peanut butter, oats, flax seed and choc chips ?Next medical visit and labwork scheduled for May.  ?  ?Physical activity: walking/ cardio 20-30 minutes 3 times a week ? ?Dietary Intake:  ?Usual eating pattern includes 2-3 meals and 2 snacks per day. ?Dining out frequency: <4 meals per week. ? ?Breakfast: usually none (sleeping); low carb eggs and sausage; overnight oats; occ cereal with unsweetened almond milk ?Snack: none ?Lunch: sandwich on high fiber bread with chicken, sm amt salad dressing, no sides; salad with spinach; taco bell taco salad lasted for 2 meals. ?Snack: 1am dark chocolate almonds; fruit ?Supper: 4/13 mexican casserole with ground [place], beans, veg, tortillas ?Snack: 5am peanut butter crackers ?Beverages: water only; did have sweet tea once.  ? ?Intervention:  ? ?Nutrition Care Education: ? ?Basic nutrition: reviewed appropriate nutrient balance; appropriate meal and snack schedule; general nutrition guidelines    ?Weight control: reviewed progress since previous visit; option of tracking food intake if weight does not begin to decrease ?Advanced nutrition:  recipe modification; cooking techniques; dining out; food label reading ?Diabetes:  goals for BGs;  appropriate carb intake and balance; healthy carb choices for lower glycemic response; role of fiber, protein, fat; frequency and best times to test BGs ? ? ? ?Nutritional Diagnosis:  Misty Glenn-2.2 Altered nutrition-related laboratory As related to Type 2 Diabetes, hyperlipidemia.  As evidenced by elevated HbA1C, total cholesterol ,and LDL. ?Misty Glenn-3.3 Overweight/obesity As related to history of excess calories, inadequate physical activity.  As evidenced by current BMI of 37.6, with patient working on lifestyle changes to improve health and lose weight. ? ? ?Education Materials given:  ?Visit summary with goals/ instructions ? ? ?Learner/ who was taught:  ?Patient  ? ? ?Level of understanding: ?Verbalizes/ demonstrates competency ? ? ?Demonstrated degree of understanding via:   Teach back ?Learning barriers: ?None ? ?Willingness to learn/ readiness for change: ?Eager, change in progress ? ? ?Monitoring and Evaluation:  Dietary intake, exercise, BG control, and body weight ?     follow up: prn ; to be scheduled after labwork as needed  ?

## 2022-03-03 NOTE — Patient Instructions (Addendum)
Continue to keep portions of starchy foods small- moderate, size of a fisted hand or less. ?Include lean proteins and generous portions of low carb veggies with meals.  ?Great job including some exercise, keep it up! ?Check some blood sugars about 2 hours after eating a meal; goal would be 150s or less. ?Call after upcoming labwork in May to schedule next follow up ?

## 2022-03-09 ENCOUNTER — Ambulatory Visit
Admission: RE | Admit: 2022-03-09 | Discharge: 2022-03-09 | Disposition: A | Discharge status: Home / Self Care | Payer: BC Managed Care – PPO | Source: Ambulatory Visit | Attending: Internal Medicine | Admitting: Internal Medicine

## 2022-03-09 DIAGNOSIS — Z1231 Encounter for screening mammogram for malignant neoplasm of breast: Secondary | ICD-10-CM | POA: Diagnosis present

## 2022-05-05 NOTE — Therapy (Unsigned)
OUTPATIENT PHYSICAL THERAPY ONCOLOGY EVALUATION  Patient Name: Misty Glenn MRN: [id number] DOB:02-Mar-1971, 51 y.o., female Today's Date: 05/08/2022   PT End of Session - 05/08/22 0811     Visit Number 1    Number of Visits 12    Date for PT Re-Evaluation 06/19/22  unable to start right away    Authorization Type BCBS    Progress Note Due on Visit 10    PT Start Time 0830    PT Stop Time 0910    PT Time Calculation (min) 40 min    Activity Tolerance Patient tolerated treatment well    Behavior During Therapy [facility] for tasks assessed/performed             Past Medical History:  Diagnosis Date   Lymphedema    Past Surgical History:  Procedure Laterality Date   ABDOMINAL HYSTERECTOMY     There are no problems to display for this patient.   PCP: Dr. Aurora Mask PROVIDER: Dr. Ouida Sills  REFERRING DIAG: Lymphedema B   THERAPY DIAG:  Lymphedema B LE   Rationale for Evaluation and Treatment Rehabilitation  ONSET DATE: chronic with acute cellulitis May 2023  SUBJECTIVE                                                                                                                                                                                           SUBJECTIVE STATEMENT:  Pt states that she has had lymphedema since 2009.  She was treated for lymphedema at this clinic in 2016.  She ordered a pump off of [employer], however it only comes up to her knee and only has two chambers.  She thinks that it has helped but the edema remains, especially in her feet which limits what shoes she can wear. .     She is not doing her exercises or self manual for her lymphedema.  She states that she can not wear her garments any longer but did order compression socks and has been wearing them periodically.      PERTINENT HISTORY:  hx of cellulitis most recent bout two weeks ago  PAIN:  Are you having pain? No  Just tight.   PRECAUTIONS: Other: cellulitis   WEIGHT BEARING  RESTRICTIONS No  FALLS:  Has patient fallen in last 6 months? No  LIVING ENVIRONMENT: Lives with: lives with their family Lives in: House/apartment OCCUPATION: [employer]   LEISURE: sedentary.  PRIOR LEVEL OF FUNCTION: Independent  PATIENT GOALS get her leg edema back down to where she can wear compression garments and shoes.    OBJECTIVE  COGNITION:  Overall cognitive  status: Within functional limits for tasks assessed   PALPATION: Noted induration in LE's   LYMPHEDEMA ASSESSMENTS:   SURGERY TYPE/DATE: none    LE LANDMARK RIGHT eval  At groin   30 cm proximal to suprapatella   20 cm proximal to suprapatella   63.7  10 cm proximal to suprapatella   54  At midpatella / popliteal crease   44.5  30 cm proximal to floor at lateral plantar foot  36.8  20 cm proximal to floor at lateral plantar foot  31  10 cm proximal to floor at lateral plantar foot  27.4  Circumference of ankle/heel 35.7  5 cm proximal to 1st MTP joint 27.5  Across MTP joint 26.3  Around proximal great toe 9.2  (Blank rows = not tested)  LE LANDMARK LEFT eval  At groin   30 cm proximal to suprapatella   20 cm proximal to suprapatella   64.5  10 cm proximal to suprapatella   54  At midpatella / popliteal crease   43  30 cm proximal to floor at lateral plantar foot  37.5  20 cm proximal to floor at lateral plantar foot  32.2  10 cm proximal to floor at lateral plantar foot  28.9  Circumference of ankle/heel 36.7  5 cm proximal to 1st MTP joint 28  Across MTP joint 26.8  Around proximal great toe 9.6  (Blank rows = not tested)  FUNCTIONAL TESTS:  LLIS :  48    TODAY'S TREATMENT  Evaluation :  Four aspect to lymphedema  care :  skin care, exercise, manual and compression.  The importance of completing each of these everyday.  Reviewed exercises and gave pt exercise sheet as she has lost her's.  Pt given sheet explaining what short stretch bandages she will need and where to purchase.    PATIENT EDUCATION:  Education details: The importance of keeping up the maintenance phase to prevent increased volume.  Person educated: Patient Education method: Explanation Education comprehension: verbalized understanding   HOME EXERCISE PROGRAM: Ankle pump, LAQ, hip ab/adduction, marching, diaphragm breathing and lymph squeeze.  ASSESSMENT:  CLINICAL IMPRESSION: Patient is a 51 y.o. female  who was seen today for physical therapy evaluation and treatment for LE lymphedema. She had a bout of cellulitis two weeks ago and her Lt leg has not gone down.  She is noting that her left foot is now swelling as well.  [name] is being referred to skilled Pt and will benefit from total decongestive techniques to reduce her volume to allow her to be able to don her shoes.  She will also benefit from a multi chamber compression pump that has thigh high leg sleeves.  Request has been sent to [facility].    OBJECTIVE IMPAIRMENTS Abnormal gait, decreased activity tolerance, difficulty walking, increased edema, obesity, and pain.   ACTIVITY LIMITATIONS dressing, hygiene/grooming, and locomotion level  PARTICIPATION LIMITATIONS: cleaning, shopping, community activity, yard work, and church  PERSONAL FACTORS Fitness and Past/current experiences are also affecting patient's functional outcome.   REHAB POTENTIAL: Good  CLINICAL DECISION MAKING: Evolving/moderate complexity  EVALUATION COMPLEXITY: Moderate  GOALS: Goals reviewed with patient? No  SHORT TERM GOALS: Target date: 05/22/2022    PT to be completing skin care and exercises on a daily basis  Baseline: Goal status: INITIAL  2.  PT  to be completing self manual techniques at least 3 x a week  Baseline:  Goal status: INITIAL    LONG TERM GOALS: Target  date: 06/05/2022   PT to have received a pump with multiple chambers and thigh high leg sleeve  Baseline:  Goal status: INITIAL  2.  PT to have lost between 2-3 cm in  measurement to be able to don her shoes. Baseline:  Goal status: INITIAL  3.  PT to have purchased new compression garments and understand that they must be donned daily  Baseline:  Goal status: INITIAL    PLAN: PT FREQUENCY: 3x/week  PT DURATION: 4 weeks  PLANNED INTERVENTIONS: Therapeutic exercises, Patient/Family education, Manual lymph drainage, Compression bandaging, and Manual therapy  PLAN FOR NEXT SESSION: begin total decongestive techniques.   Virgina Organ, PT CLT [phone]  05/08/2022,

## 2022-05-08 ENCOUNTER — Ambulatory Visit (HOSPITAL_COMMUNITY): Payer: BC Managed Care – PPO | Attending: Internal Medicine | Admitting: Physical Therapy

## 2022-05-08 DIAGNOSIS — I890 Lymphedema, not elsewhere classified: Secondary | ICD-10-CM | POA: Diagnosis present

## 2022-05-10 ENCOUNTER — Encounter (HOSPITAL_COMMUNITY): Payer: BC Managed Care – PPO | Admitting: Physical Therapy

## 2022-05-12 ENCOUNTER — Encounter (HOSPITAL_COMMUNITY): Payer: BC Managed Care – PPO

## 2022-05-16 ENCOUNTER — Ambulatory Visit (HOSPITAL_COMMUNITY): Payer: BC Managed Care – PPO | Admitting: Physical Therapy

## 2022-05-16 DIAGNOSIS — I890 Lymphedema, not elsewhere classified: Secondary | ICD-10-CM | POA: Diagnosis not present

## 2022-05-16 NOTE — Therapy (Signed)
OUTPATIENT PHYSICAL THERAPY  Lymphedema Treatment  Patient Name: Misty Glenn MRN: [id number] DOB:05-25-1971, 51 y.o., female Today's Date: 05/16/2022  END OF SESSION:   PT End of Session - 05/16/22 1436     Visit Number 2    Number of Visits 12    Date for PT Re-Evaluation 06/19/22    Authorization Type BCBS    Progress Note Due on Visit 10    PT Start Time 0918    PT Stop Time 1000    PT Time Calculation (min) 42 min    Activity Tolerance Patient tolerated treatment well    Behavior During Therapy [facility]FL for tasks assessed/performed                 Past Medical History:  Diagnosis Date   Lymphedema    Past Surgical History:  Procedure Laterality Date   ABDOMINAL HYSTERECTOMY     There are no problems to display for this patient.   PCP: Dr. Aurora Mask PROVIDER: Dr. Ouida Sills  REFERRING DIAG: Lymphedema B   THERAPY DIAG:  Lymphedema B LE   Rationale for Evaluation and Treatment Rehabilitation  ONSET DATE: chronic with acute cellulitis May 2023  SUBJECTIVE                                                                                                                                                                                           SUBJECTIVE STATEMENT:  Pt states that she found some of her bandages.  States her pump only goes to knee level and she has been using it as well as wearing her new knee high compression garments. No pain or issues.  PERTINENT HISTORY:  hx of cellulitis most recent bout two weeks ago  PAIN:  Are you having pain? No  Just tight.   PRECAUTIONS: Other: cellulitis   WEIGHT BEARING RESTRICTIONS No  FALLS:  Has patient fallen in last 6 months? No  LIVING ENVIRONMENT: Lives with: lives with their family Lives in: House/apartment OCCUPATION: [employer]   LEISURE: sedentary.  PRIOR LEVEL OF FUNCTION: Independent  PATIENT GOALS get her leg edema back down to where she can wear compression garments and  shoes.    OBJECTIVE  COGNITION:  Overall cognitive status: Within functional limits for tasks assessed   PALPATION: Noted induration in LE's   LYMPHEDEMA ASSESSMENTS:   SURGERY TYPE/DATE: none    LE LANDMARK RIGHT eval  At groin   30 cm proximal to suprapatella   20 cm proximal to suprapatella   63.7  10 cm proximal to suprapatella   54  At midpatella / popliteal crease  44.5  30 cm proximal to floor at lateral plantar foot  36.8  20 cm proximal to floor at lateral plantar foot  31  10 cm proximal to floor at lateral plantar foot  27.4  Circumference of ankle/heel 35.7  5 cm proximal to 1st MTP joint 27.5  Across MTP joint 26.3  Around proximal great toe 9.2  (Blank rows = not tested)  LE LANDMARK LEFT eval  At groin   30 cm proximal to suprapatella   20 cm proximal to suprapatella   64.5  10 cm proximal to suprapatella   54  At midpatella / popliteal crease   43  30 cm proximal to floor at lateral plantar foot  37.5  20 cm proximal to floor at lateral plantar foot  32.2  10 cm proximal to floor at lateral plantar foot  28.9  Circumference of ankle/heel 36.7  5 cm proximal to 1st MTP joint 28  Across MTP joint 26.8  Around proximal great toe 9.6  (Blank rows = not tested)  FUNCTIONAL TESTS:  LLIS :  48    TODAY'S TREATMENT  Evaluation :  Four aspect to lymphedema  care :  skin care, exercise, manual and compression.  The importance of completing each of these everyday.  Reviewed exercises and gave pt exercise sheet as she has lost her's.  Pt given sheet explaining what short stretch bandages she will need and where to purchase.   PATIENT EDUCATION:  Education details: The importance of keeping up the maintenance phase to prevent increased volume.  Person educated: Patient Education method: Explanation Education comprehension: verbalized understanding   HOME EXERCISE PROGRAM: Ankle pump, LAQ, hip ab/adduction, marching, diaphragm breathing and lymph  squeeze.  ASSESSMENT:  CLINICAL IMPRESSION: Pt returns with all short stretch for Lt distal LE, however missing anterior piece of foam.  New foam cut as well as orange, more dense foam for medial and lateral malleoli.   Manual lymph drainage completed for Lt LE including application of lotion and #7 liner.  Pt reported overall comfort following bandaging.  Suggested she purchase larger shoe to wear on Lt LE as difficulty donning bedroom shoes worn to therapy today. Pt informed that a better, multi chamber compression pump that has thigh high leg sleeves has been ordered through [employer].  Pt will continue to benefit from skilled physical therapy for lymphatic treatment.    OBJECTIVE IMPAIRMENTS Abnormal gait, decreased activity tolerance, difficulty walking, increased edema, obesity, and pain.   ACTIVITY LIMITATIONS dressing, hygiene/grooming, and locomotion level  PARTICIPATION LIMITATIONS: cleaning, shopping, community activity, yard work, and church  PERSONAL FACTORS Fitness and Past/current experiences are also affecting patient's functional outcome.   REHAB POTENTIAL: Good  CLINICAL DECISION MAKING: Evolving/moderate complexity  EVALUATION COMPLEXITY: Moderate  GOALS: Goals reviewed with patient? No  SHORT TERM GOALS: Target date: 05/30/2022    PT to be completing skin care and exercises on a daily basis  Baseline: Goal status: IN PROGRESS  2.  PT  to be completing self manual techniques at least 3 x a week  Baseline:  Goal status: IN PROGRESS    LONG TERM GOALS: Target date: 06/13/2022   PT to have received a pump with multiple chambers and thigh high leg sleeve  Baseline:  Goal status: IN PROGRESS  2.  PT to have lost between 2-3 cm in measurement to be able to don her shoes. Baseline:  Goal status: IN PROGRESS  3.  PT to have purchased new compression garments and  understand that they must be donned daily  Baseline:  Goal status: IN PROGRESS    PLAN: PT  FREQUENCY: 3x/week  PT DURATION: 4 weeks  PLANNED INTERVENTIONS: Therapeutic exercises, Patient/Family education, Manual lymph drainage, Compression bandaging, and Manual therapy  PLAN FOR NEXT SESSION:  continue total decongestive techniques. Measure weekly.   Lurena Nida, PTA/CLT [facility] [place] Ph: [phone] 05/16/2022

## 2022-05-18 ENCOUNTER — Encounter (HOSPITAL_COMMUNITY): Payer: BC Managed Care – PPO | Admitting: Physical Therapy

## 2022-05-19 ENCOUNTER — Encounter (HOSPITAL_COMMUNITY): Payer: BC Managed Care – PPO

## 2022-05-30 ENCOUNTER — Ambulatory Visit (HOSPITAL_COMMUNITY): Payer: BC Managed Care – PPO | Attending: Internal Medicine | Admitting: Physical Therapy

## 2022-05-30 DIAGNOSIS — I890 Lymphedema, not elsewhere classified: Secondary | ICD-10-CM | POA: Diagnosis not present

## 2022-05-30 NOTE — Therapy (Addendum)
OUTPATIENT PHYSICAL THERAPY  Lymphedema Treatment Discharge Patient Name: Misty Glenn MRN: [id number] DOB:Oct 20, 1971, 51 y.o., female Today's Date: 05/30/2022 PHYSICAL THERAPY DISCHARGE SUMMARY  Visits from Start of Care: 4  Current functional level related to goals / functional outcomes: Measurement at previous level; pt obtained new garments   Remaining deficits: Still does not have the full pump   Education / Equipment: HEP the need to wear compression and pump daily.   Patient agrees to discharge. Patient goals were met. Patient is being discharged due to meeting the stated rehab goals.  END OF SESSION:   PT End of Session - 05/30/22 1010     Visit Number 4    Number of Visits 4   Date for PT Re-Evaluation 06/19/22    Authorization Type BCBS    Progress Note Due on Visit 10    PT Start Time 0903    PT Stop Time 0926    PT Time Calculation (min) 23 min    Activity Tolerance Patient tolerated treatment well    Behavior During Therapy [facility] for tasks assessed/performed                 Past Medical History:  Diagnosis Date   Lymphedema    Past Surgical History:  Procedure Laterality Date   ABDOMINAL HYSTERECTOMY     There are no problems to display for this patient.   PCP: Dr. Donia Guiles PROVIDER: Dr. Willey Blade  REFERRING DIAG: Lymphedema B   THERAPY DIAG:  Lymphedema B LE   Rationale for Evaluation and Treatment Rehabilitation  ONSET DATE: chronic with acute cellulitis May 2023  SUBJECTIVE                                                                                                                                                                                           SUBJECTIVE STATEMENT:  Pt returns today following 2 week absence.  States she feels she feels like she can be done with therapy at this point.  States she went to [facility] and got all new knee highs (wearing some today with good fit).  Reports compliance with self  massage and use of pump; eager to get her new pump as it will go to her thighs.  PERTINENT HISTORY:  hx of cellulitis most recent bout two weeks ago  PAIN:  Are you having pain? No  Just tight.   PRECAUTIONS: Other: cellulitis   WEIGHT BEARING RESTRICTIONS No  FALLS:  Has patient fallen in last 6 months? No  LIVING ENVIRONMENT: Lives with: lives with their family Lives in: House/apartment OCCUPATION: [employer]  LEISURE: sedentary.  PRIOR LEVEL OF FUNCTION: Independent  PATIENT GOALS get her leg edema back down to where she can wear compression garments and shoes.    OBJECTIVE  COGNITION:  Overall cognitive status: Within functional limits for tasks assessed   PALPATION: Noted induration in LE's   LYMPHEDEMA ASSESSMENTS:   SURGERY TYPE/DATE: none    LE LANDMARK RIGHT eval Right 05/30/22  At groin    30 cm proximal to suprapatella    20 cm proximal to suprapatella   63.7 64  10 cm proximal to suprapatella   54 52  At midpatella / popliteal crease   44.5 41  30 cm proximal to floor at lateral plantar foot  36.8 35  20 cm proximal to floor at lateral plantar foot  31 30  10  cm proximal to floor at lateral plantar foot  27.4 26.5  Circumference of ankle/heel 35.7 34  5 cm proximal to 1st MTP joint 27.5 27  Across MTP joint 26.3 24  Around proximal great toe 9.2   (Blank rows = not tested)  LE LANDMARK LEFT eval Left 05/30/22  At groin    30 cm proximal to suprapatella    20 cm proximal to suprapatella   64.5 64  10 cm proximal to suprapatella   54 52  At midpatella / popliteal crease   43 41  30 cm proximal to floor at lateral plantar foot  37.5 37.5  20 cm proximal to floor at lateral plantar foot  32.2 32  10 cm proximal to floor at lateral plantar foot  28.9 28  Circumference of ankle/heel 36.7 35.2  5 cm proximal to 1st MTP joint 28 26.5  Across MTP joint 26.8 25  Around proximal great toe 9.6   (Blank rows = not tested)  FUNCTIONAL TESTS:   LLIS :  48    TODAY'S TREATMENT  Evaluation :  Four aspect to lymphedema  care :  skin care, exercise, manual and compression.  The importance of completing each of these everyday.  Reviewed exercises and gave pt exercise sheet as she has lost her's.  Pt given sheet explaining what short stretch bandages she will need and where to purchase.   PATIENT EDUCATION:  Education details: The importance of keeping up the maintenance phase to prevent increased volume.  Person educated: Patient Education method: Explanation Education comprehension: verbalized understanding   HOME EXERCISE PROGRAM: Ankle pump, LAQ, hip ab/adduction, marching, diaphragm breathing and lymph squeeze.  ASSESSMENT:  CLINICAL IMPRESSION: Pt returns today with request to be discharged from skilled lymphatic treatment.  Measured with noted reduction in both LE's. Pt is compliant with pump usage and wearing her stockings.  Pt has met all goals with exception of obtaining new thigh high pump, which should be delivered in the next couple of weeks.  Pt does have a knee high level pump she ordered off [employer] that she is using until this.  Pt without any further questions or concerns and is ready for discharge.     OBJECTIVE IMPAIRMENTS Abnormal gait, decreased activity tolerance, difficulty walking, increased edema, obesity, and pain.   ACTIVITY LIMITATIONS dressing, hygiene/grooming, and locomotion level  PARTICIPATION LIMITATIONS: cleaning, shopping, community activity, yard work, and church  [place] and Past/current experiences are also affecting patient's functional outcome.   REHAB POTENTIAL: Good  CLINICAL DECISION MAKING: Evolving/moderate complexity  EVALUATION COMPLEXITY: Moderate  GOALS: Goals reviewed with patient? No  SHORT TERM GOALS: Target date: 06/13/2022    PT to  be completing skin care and exercises on a daily basis  Baseline: Goal status: MET  2.  PT  to be completing self  manual techniques at least 3 x a week  Baseline:  Goal status: MET    LONG TERM GOALS: Target date: 06/27/2022   PT to have received a pump with multiple chambers and thigh high leg sleeve  Baseline:  Goal status: IN PROGRESS   2.  PT to have lost between 2-3 cm in measurement to be able to don her shoes. Baseline:  Goal status: MET  3.  PT to have purchased new compression garments and understand that they must be donned daily  Baseline:  Goal status: MET    PLAN: PT FREQUENCY: 3x/week  PT DURATION: 4 weeks  PLANNED INTERVENTIONS: Therapeutic exercises, Patient/Family education, Manual lymph drainage, Compression bandaging, and Manual therapy  PLAN FOR NEXT SESSION:  Discharge to self maintenance.   Teena Irani, PTA/CLT Rayetta Humphrey, PT CLT [place] Ph: [phone] 05/30/2022

## 2022-05-31 ENCOUNTER — Encounter (HOSPITAL_COMMUNITY): Payer: BC Managed Care – PPO | Admitting: Physical Therapy

## 2022-06-01 ENCOUNTER — Encounter (HOSPITAL_COMMUNITY): Payer: BC Managed Care – PPO | Admitting: Physical Therapy

## 2022-06-02 ENCOUNTER — Encounter (HOSPITAL_COMMUNITY): Payer: BC Managed Care – PPO

## 2022-06-05 ENCOUNTER — Encounter (HOSPITAL_COMMUNITY): Payer: BC Managed Care – PPO | Admitting: Physical Therapy

## 2022-06-07 ENCOUNTER — Encounter (HOSPITAL_COMMUNITY): Payer: BC Managed Care – PPO

## 2022-06-09 ENCOUNTER — Encounter (HOSPITAL_COMMUNITY): Payer: BC Managed Care – PPO

## 2022-06-13 ENCOUNTER — Encounter (HOSPITAL_COMMUNITY): Payer: BC Managed Care – PPO | Admitting: Physical Therapy

## 2022-06-14 ENCOUNTER — Encounter (HOSPITAL_COMMUNITY): Payer: BC Managed Care – PPO | Admitting: Physical Therapy

## 2022-06-19 ENCOUNTER — Encounter (HOSPITAL_COMMUNITY): Payer: BC Managed Care – PPO | Admitting: Physical Therapy

## 2022-06-21 ENCOUNTER — Encounter (HOSPITAL_COMMUNITY): Payer: BC Managed Care – PPO | Admitting: Physical Therapy

## 2022-06-23 ENCOUNTER — Encounter (HOSPITAL_COMMUNITY): Payer: BC Managed Care – PPO

## 2022-06-26 ENCOUNTER — Encounter (HOSPITAL_COMMUNITY): Payer: BC Managed Care – PPO | Admitting: Physical Therapy

## 2022-06-28 ENCOUNTER — Encounter (HOSPITAL_COMMUNITY): Payer: BC Managed Care – PPO | Admitting: Physical Therapy

## 2022-06-30 ENCOUNTER — Encounter (HOSPITAL_COMMUNITY): Payer: BC Managed Care – PPO | Admitting: Physical Therapy

## 2024-06-06 ENCOUNTER — Encounter: Payer: Self-pay | Admitting: Advanced Practice Midwife

## 2024-07-29 ENCOUNTER — Encounter: Payer: Self-pay | Admitting: *Deleted

## 2024-07-29 ENCOUNTER — Emergency Department: Admission: EM | Admit: 2024-07-29 | Discharge: 2024-07-29 | Disposition: A | Discharge status: Home / Self Care

## 2024-07-29 ENCOUNTER — Other Ambulatory Visit: Payer: Self-pay

## 2024-07-29 DIAGNOSIS — Y92410 Unspecified street and highway as the place of occurrence of the external cause: Secondary | ICD-10-CM | POA: Diagnosis not present

## 2024-07-29 DIAGNOSIS — I10 Essential (primary) hypertension: Secondary | ICD-10-CM | POA: Insufficient documentation

## 2024-07-29 DIAGNOSIS — R519 Headache, unspecified: Secondary | ICD-10-CM | POA: Insufficient documentation

## 2024-07-29 MED ORDER — IBUPROFEN 600 MG PO TABS
600.0000 mg | ORAL_TABLET | Freq: Once | ORAL | Status: AC
  Administered 2024-07-29: 600 mg via ORAL
  Filled 2024-07-29: qty 1
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
